# Patient Record
Sex: Female | Born: 1980 | Race: White | Hispanic: No
Health system: Midwestern US, Community
[De-identification: ages and names within clinical notes are randomized; demographics above are authoritative.]

## PROBLEM LIST (undated history)

## (undated) DIAGNOSIS — B9689 Other specified bacterial agents as the cause of diseases classified elsewhere: Secondary | ICD-10-CM

## (undated) DIAGNOSIS — N760 Acute vaginitis: Secondary | ICD-10-CM

## (undated) DIAGNOSIS — F4310 Post-traumatic stress disorder, unspecified: Secondary | ICD-10-CM

## (undated) DIAGNOSIS — F32A Depression, unspecified: Secondary | ICD-10-CM

## (undated) DIAGNOSIS — J40 Bronchitis, not specified as acute or chronic: Secondary | ICD-10-CM

## (undated) DIAGNOSIS — F329 Major depressive disorder, single episode, unspecified: Secondary | ICD-10-CM

## (undated) DIAGNOSIS — F1910 Other psychoactive substance abuse, uncomplicated: Secondary | ICD-10-CM

## (undated) DIAGNOSIS — G510 Bell's palsy: Secondary | ICD-10-CM

## (undated) DIAGNOSIS — R569 Unspecified convulsions: Secondary | ICD-10-CM

## (undated) DIAGNOSIS — J45909 Unspecified asthma, uncomplicated: Secondary | ICD-10-CM

## (undated) HISTORY — PX: OTHER SURGICAL HISTORY: SHX169

## (undated) HISTORY — DX: Bell's palsy: G51.0

---

## 2002-03-18 ENCOUNTER — Encounter: Payer: Self-pay | Admitting: Emergency Medicine

## 2002-03-18 ENCOUNTER — Emergency Department (HOSPITAL_COMMUNITY): Admission: EM | Admit: 2002-03-18 | Discharge: 2002-03-18 | Payer: Self-pay | Admitting: Emergency Medicine

## 2002-06-01 ENCOUNTER — Emergency Department (HOSPITAL_COMMUNITY): Admission: EM | Admit: 2002-06-01 | Discharge: 2002-06-01 | Payer: Self-pay | Admitting: Emergency Medicine

## 2002-08-09 ENCOUNTER — Inpatient Hospital Stay (HOSPITAL_COMMUNITY): Admission: EM | Admit: 2002-08-09 | Discharge: 2002-08-10 | Payer: Self-pay | Admitting: Emergency Medicine

## 2002-08-09 ENCOUNTER — Encounter: Payer: Self-pay | Admitting: Emergency Medicine

## 2005-05-11 ENCOUNTER — Inpatient Hospital Stay (HOSPITAL_COMMUNITY): Admission: EM | Admit: 2005-05-11 | Discharge: 2005-05-12 | Payer: Self-pay | Admitting: Emergency Medicine

## 2007-02-25 ENCOUNTER — Ambulatory Visit: Payer: Self-pay | Admitting: Physician Assistant

## 2007-02-25 ENCOUNTER — Inpatient Hospital Stay (HOSPITAL_COMMUNITY): Admission: AD | Admit: 2007-02-25 | Discharge: 2007-02-25 | Payer: Self-pay | Admitting: Obstetrics & Gynecology

## 2007-03-13 ENCOUNTER — Inpatient Hospital Stay (HOSPITAL_COMMUNITY): Admission: AD | Admit: 2007-03-13 | Discharge: 2007-03-13 | Payer: Self-pay | Admitting: Family Medicine

## 2007-03-13 ENCOUNTER — Ambulatory Visit: Payer: Self-pay | Admitting: Advanced Practice Midwife

## 2007-03-21 ENCOUNTER — Inpatient Hospital Stay (HOSPITAL_COMMUNITY): Admission: AD | Admit: 2007-03-21 | Discharge: 2007-03-23 | Payer: Self-pay | Admitting: Family Medicine

## 2007-03-21 ENCOUNTER — Ambulatory Visit: Payer: Self-pay | Admitting: *Deleted

## 2007-08-26 ENCOUNTER — Other Ambulatory Visit: Admission: RE | Admit: 2007-08-26 | Discharge: 2007-08-26 | Payer: Self-pay | Admitting: Obstetrics and Gynecology

## 2007-11-24 ENCOUNTER — Encounter: Payer: Self-pay | Admitting: Orthopedic Surgery

## 2008-07-30 ENCOUNTER — Encounter (INDEPENDENT_AMBULATORY_CARE_PROVIDER_SITE_OTHER): Payer: Self-pay | Admitting: General Surgery

## 2008-07-30 ENCOUNTER — Ambulatory Visit (HOSPITAL_COMMUNITY): Admission: RE | Admit: 2008-07-30 | Discharge: 2008-07-30 | Payer: Self-pay | Admitting: General Surgery

## 2008-09-16 ENCOUNTER — Other Ambulatory Visit: Admission: RE | Admit: 2008-09-16 | Discharge: 2008-09-16 | Payer: Self-pay | Admitting: Obstetrics and Gynecology

## 2009-04-04 ENCOUNTER — Emergency Department (HOSPITAL_COMMUNITY): Admission: EM | Admit: 2009-04-04 | Discharge: 2009-04-04 | Payer: Self-pay | Admitting: Emergency Medicine

## 2009-07-05 ENCOUNTER — Ambulatory Visit (HOSPITAL_COMMUNITY): Admission: RE | Admit: 2009-07-05 | Discharge: 2009-07-05 | Payer: Self-pay | Admitting: Psychiatry

## 2009-09-03 ENCOUNTER — Inpatient Hospital Stay (HOSPITAL_COMMUNITY): Admission: EM | Admit: 2009-09-03 | Discharge: 2009-09-04 | Payer: Self-pay | Admitting: Emergency Medicine

## 2009-09-04 ENCOUNTER — Ambulatory Visit: Payer: Self-pay | Admitting: Psychiatry

## 2009-09-04 ENCOUNTER — Inpatient Hospital Stay (HOSPITAL_COMMUNITY): Admission: AD | Admit: 2009-09-04 | Discharge: 2009-09-07 | Payer: Self-pay | Admitting: Psychiatry

## 2009-10-07 ENCOUNTER — Emergency Department (HOSPITAL_COMMUNITY): Admission: EM | Admit: 2009-10-07 | Discharge: 2009-10-07 | Payer: Self-pay | Admitting: Emergency Medicine

## 2010-02-05 ENCOUNTER — Encounter: Payer: Self-pay | Admitting: Obstetrics & Gynecology

## 2010-02-08 ENCOUNTER — Emergency Department (HOSPITAL_COMMUNITY)
Admission: EM | Admit: 2010-02-08 | Discharge: 2010-02-08 | Payer: Self-pay | Source: Home / Self Care | Admitting: Emergency Medicine

## 2010-03-30 LAB — BASIC METABOLIC PANEL
BUN: 6 mg/dL (ref 6–23)
Chloride: 109 mEq/L (ref 96–112)
Glucose, Bld: 92 mg/dL (ref 70–99)
Potassium: 3.6 mEq/L (ref 3.5–5.1)

## 2010-03-30 LAB — CARDIAC PANEL(CRET KIN+CKTOT+MB+TROPI)
CK, MB: 0.7 ng/mL (ref 0.3–4.0)
CK, MB: 1 ng/mL (ref 0.3–4.0)
Relative Index: INVALID (ref 0.0–2.5)
Total CK: 59 U/L (ref 7–177)
Total CK: 84 U/L (ref 7–177)
Troponin I: 0.02 ng/mL (ref 0.00–0.06)

## 2010-03-30 LAB — MRSA PCR SCREENING: MRSA by PCR: NEGATIVE

## 2010-03-30 LAB — CBC
HCT: 41.3 % (ref 36.0–46.0)
Platelets: 268 10*3/uL (ref 150–400)
Platelets: 287 10*3/uL (ref 150–400)
RDW: 12.9 % (ref 11.5–15.5)
RDW: 13 % (ref 11.5–15.5)
WBC: 12.7 10*3/uL — ABNORMAL HIGH (ref 4.0–10.5)
WBC: 15.8 10*3/uL — ABNORMAL HIGH (ref 4.0–10.5)

## 2010-03-30 LAB — URINALYSIS, ROUTINE W REFLEX MICROSCOPIC
Leukocytes, UA: NEGATIVE
Nitrite: NEGATIVE
Nitrite: NEGATIVE
Specific Gravity, Urine: 1.01 (ref 1.005–1.030)
Specific Gravity, Urine: 1.025 (ref 1.005–1.030)
Urobilinogen, UA: 0.2 mg/dL (ref 0.0–1.0)
Urobilinogen, UA: 0.2 mg/dL (ref 0.0–1.0)
pH: 6 (ref 5.0–8.0)

## 2010-03-30 LAB — COMPREHENSIVE METABOLIC PANEL
ALT: 12 U/L (ref 0–35)
Albumin: 4.3 g/dL (ref 3.5–5.2)
Alkaline Phosphatase: 83 U/L (ref 39–117)
Potassium: 3.5 mEq/L (ref 3.5–5.1)
Sodium: 139 mEq/L (ref 135–145)
Total Protein: 7.5 g/dL (ref 6.0–8.3)

## 2010-03-30 LAB — DIFFERENTIAL
Basophils Absolute: 0 10*3/uL (ref 0.0–0.1)
Lymphocytes Relative: 27 % (ref 12–46)
Monocytes Absolute: 1.1 10*3/uL — ABNORMAL HIGH (ref 0.1–1.0)
Neutro Abs: 10.3 10*3/uL — ABNORMAL HIGH (ref 1.7–7.7)
Neutrophils Relative %: 65 % (ref 43–77)

## 2010-03-30 LAB — PREGNANCY, URINE: Preg Test, Ur: NEGATIVE

## 2010-03-30 LAB — URINE MICROSCOPIC-ADD ON

## 2010-03-30 LAB — ETHANOL: Alcohol, Ethyl (B): <5 mg/dL (ref 0–10)

## 2010-03-30 LAB — RAPID URINE DRUG SCREEN, HOSP PERFORMED
Opiates: NOT DETECTED
Tetrahydrocannabinol: POSITIVE — AB

## 2010-04-23 LAB — HEMOGLOBIN AND HEMATOCRIT, BLOOD
HCT: 43.5 % (ref 36.0–46.0)
Hemoglobin: 15.4 g/dL — ABNORMAL HIGH (ref 12.0–15.0)

## 2010-04-23 LAB — BASIC METABOLIC PANEL
Calcium: 9.7 mg/dL (ref 8.4–10.5)
Creatinine, Ser: 0.91 mg/dL (ref 0.4–1.2)
GFR calc Af Amer: >60 mL/min (ref >60)
GFR calc non Af Amer: >60 mL/min (ref >60)
Sodium: 141 mEq/L (ref 135–145)

## 2010-05-30 NOTE — Op Note (Signed)
NAME:  Glenda [name], Glenda [name]               ACCOUNT NO.:  [id number]   MEDICAL RECORD NO.:  [id number]          PATIENT TYPE:  AMB   LOCATION:  DAY                           FACILITY:  [facility]   PHYSICIAN:  Tilford Pillar, MD      DATE OF BIRTH:  30-Aug-1980   DATE OF PROCEDURE:  07/30/2008  DATE OF DISCHARGE:                               OPERATIVE REPORT   PREOPERATIVE DIAGNOSIS:  Frontal scalp cyst.   POSTOPERATIVE DIAGNOSIS:  Frontal scalp lesion/mass.   PROCEDURE:  Excision of frontal scalp mass via 2-cm incision.   SURGEON:  Tilford Pillar, MD   ANESTHESIA:  MAC with 1% lidocaine with epinephrine.   ESTIMATED BLOOD LOSS:  Minimal.   SPECIMEN:  Soft tissue mass of the frontal scalp, suspect lipoma.   INDICATIONS:  The patient is a 30 year old female who presented to my  office with a small nodule on her frontal scalp.  This had slowly  increased in size and due to its increase in size and position, the  patient was concerned about any additional changes.  We did discuss the  risks, benefits, and alternatives of excision including, but not limited  to risk of bleeding, infection, recurrence.  The patient's questions and  concerns were addressed and the patient was consented for the planned  procedure.   OPERATION:  The patient was taken to the operating room, was placed in  the supine position on the operating table, at which time the sedation  was administered.  Once the patient was sedated, her scalp was prepped  with Betadine solution and draped in standard fashion.  Local anesthetic  has been instilled around the planned area of incision with 1%  lidocaine.  A skin incision was created with a #15 blade scalpel.  Additional dissection down to the subcuticular tissue was carried out  using needle tip electrocautery.  This includes dissection around the  soft tissue mass.  The mass was adjacent, but it was not connected to  the frontal skull.  Periosteum was visualized, but was not  entered into,  continued to dissect the mass free, and once free, it was placed on the  back table and sent as a permanent specimen to Pathology.  At this time,  the field was irrigated and a 4-0 Monocryl suture was utilized to  reapproximate the skin edges in a running subcuticular suture.  The skin  was washed and dried with a moist and dry towel.  Benzoin was applied  around the incision.  Half-inch Steri-Strips were placed over the  incision.  The drapes  were removed.  The patient was allowed to come out of general anesthetic  and transferred back to regular hospital bed.  At the conclusion of the  procedure, all instrument, sponge, and needle counts were correct.  The  patient tolerated the procedure well.      Tilford Pillar, MD  Electronically Signed     BZ/MEDQ  D:  07/30/2008  T:  07/30/2008  Job:  [id number]

## 2010-05-30 NOTE — H&P (Signed)
NAME:  Glenda [name], Glenda [name]               ACCOUNT NO.:  [id number]   MEDICAL RECORD NO.:  [id number]           PATIENT TYPE:  AMB   LOCATION:  DAY                           FACILITY:  [facility]   PHYSICIAN:  Tilford Pillar, MD      DATE OF BIRTH:  12-Aug-1980   DATE OF ADMISSION:  DATE OF DISCHARGE:  LH                              HISTORY & PHYSICAL   CHIEF COMPLAINT:  Frontal scalp cyst.   HISTORY OF PRESENT ILLNESS:  The patient is a 30 year old female, who  represents to my office with a history of a nodule on her frontal scalp  near the hairline.  This has been present for several years and slowly  increased in size.  The patient had been evaluated previously and the  risks, benefits, and also excision have been discussed.  The patient had  decided to continue and watch this conservatively.  In the interim, she  has noted continued increase in size and due to its prominence and  location and due to the increasing size, and has concerned about having  excised.  She has had no fever and chills.  She has had no significant  weight change with weight loss or weight gain.  She has had no erythema  or discharge from the lesion.  She has not noticed any other changes.   PAST MEDICAL HISTORY:  None.   PAST SURGICAL HISTORY:  None.   MEDICATIONS:  None.   ALLERGIES:  No known drug allergies.   SOCIAL HISTORY:  One-pack per day smoker.  No alcohol use.  No  recreational drug use.   REVIEW OF SYSTEMS:  Unremarkable for all systems other than skin as per  HPI.  Otherwise unremarkable for constitutional, eyes, ears, nose,  throat, respiratory, cardiovascular, gastrointestinal, genitourinary,  endocrine, and neuro.  On musculoskeletal, she does complain of  arthralgias of the neck, back, and joint.   PHYSICAL EXAMINATION:  GENERAL:  The patient is a healthy-appearing  female.  She is calm.  She is not in any acute distress.  She is alert  and oriented x3.  HEENT:  Scalp.  Hair is palpated in  appearance.  There  is no evidence of any brutal hair.  She has approximately 1.5 cm mobile  soft, nontender nodule near the midline of her left frontal scalp.  This  does not appear to be attached to underlying structures or surfaces.  There is no overlying erythema.  There is no fluctuance.  Eyes, pupils  are equal, round, and reactive.  Extraocular movements are intact.  No  conjunctival pallor are noted.  Oral mucosa is pink.  Normal occlusion.  NECK:  Trachea is midline.  No cervical lymphadenopathy.  No thyroid  nodularity or goiters apparent.  PULMONARY:  Unlabored respiration.  She is clear to auscultation  bilaterally.  CARDIOVASCULAR:  Regular rate and rhythm.  She has 2+ radial, femoral,  and dorsalis pedis pulses bilaterally.  ABDOMEN:  Positive bowel sounds.  Abdomen is soft and nontender.  EXTREMITIES:  Warm and dry.   ASSESSMENT AND PLAN:  Sebaceous cyst of the  left frontal scalp.  Risks,  benefits, and alternatives again were discussed with the patient  including, but not limited to the risk of bleeding, infection, and  recurrence.  It was associated with excision of a cyst of the scalp.  Her questions and concerns were addressed and the patient does wish to  proceed with a excision of the cyst.  This will be arranged at her  earliest convenience as an outpatient.      Tilford Pillar, MD  Electronically Signed     BZ/MEDQ  D:  07/15/2008  T:  07/16/2008  Job:  [id number]   cc:   Lorin Picket A. Gerda Diss, MD  Fax: [phone]

## 2010-06-02 NOTE — H&P (Signed)
NAME:  Glenda [name], Glenda [name]               ACCOUNT NO.:  [id number]   MEDICAL RECORD NO.:  [id number]          PATIENT TYPE:  AMB   LOCATION:  DAY                           FACILITY:  [facility]   PHYSICIAN:  Tilford Pillar, MD      DATE OF BIRTH:  1980/02/22   DATE OF ADMISSION:  DATE OF DISCHARGE:  LH                              HISTORY & PHYSICAL   CHIEF COMPLAINT:  Cyst on forehead.   HISTORY OF PRESENT ILLNESS:  The patient is a 30 year old female who  noted several years ago a sizable knot on her forehead.  This has slowly  increased in size.  She has denied any discharge or drainage from this.  She has had no overlying erythema or no significant pain associated with  this.  She does not have any other prior history of cyst.  No other skin  lesions.   PAST MEDICAL HISTORY:  None.   PAST SURGICAL HISTORY:  None.   MEDICATIONS:  Flexeril.   ALLERGIES:  No known drug allergies.   SOCIAL HISTORY:  She is 1-pack-per-day smoker.  No alcohol use.  No  recreational drug use.  She has had one pregnancy.  Occupation, no  current employment.   REVIEW OF SYSTEMS:  CONSTITUTIONAL:  Complains of headaches.  EYES:  Unremarkable.  EARS, NOSE, AND THROAT:  Unremarkable.  RESPIRATORY:  Unremarkable.  CARDIOVASCULAR:  Unremarkable.  GASTROINTESTINAL:  Unremarkable.  GENITOURINARY:  Unremarkable.  MUSCULOSKELETAL:  Arthralgias of the neck, back, and joints.  SKIN:  Unremarkable.  ENDOCRINE:  Unremarkable.  NEURO:  Unremarkable.   PHYSICAL EXAMINATION:  GENERAL:  The patient is a healthy-appearing  female in no acute distress.  She is alert and oriented x3.  HEENT:  Scalp, she does have a mobile, round, nontender, soft nodule on  the frontal aspect of the scalp.  This is in the midline of her frontal  region.  Eyes, pupils equal, round, and reactive to light.  Extraocular  movements are intact.  No scleral icterus or conjunctival pallor is  noted.  Oral mucosa is pink.  Normal occlusion.  NECK:   Trachea is midline.  She does have a small palpable right thyroid  nodule.  This is nontender.  No cervical lymphadenopathy.  PULMONARY:  Unlabored respirations.  No wheezes.  No crackles.  She is  clear to auscultation bilaterally.  CARDIOVASCULAR:  Regular rate and rhythm.  No murmurs or gallops.  She  has 2+ radial pulses.  ABDOMEN:  Positive bowel sounds.  Abdomen is soft and nontender.  SKIN:  Warm and dry.   ASSESSMENT AND PLAN:  1. Sebaceous cyst in the scalp.  2. Thyroid nodule.   At this point, I did discuss with the patient the risks, benefits, and  alternatives of an excision.  On the initial evaluation, the patient did  wish to wait until in the future to address this; however, at the  initial time, the patient has decided to proceed with excision.  Risks,  benefits, and alternatives including, but not limited to risk of  bleeding, infection, and recurrence were discussed.  We  will proceed if  the patient with regards to the nodule.  Apparently, the patient is  being evaluated and worked up by her primary care physician; however, if  it persists, I would recommend proceeding with an ultrasound of the  thyroid for additional evaluation and possible FNA.      Tilford Pillar, MD  Electronically Signed     BZ/MEDQ  D:  10/16/2007  T:  10/17/2007  Job:  [id number]

## 2010-06-02 NOTE — Discharge Summary (Signed)
   NAME:  Glenda Glenda [name], Glenda Glenda [name]                         ACCOUNT NO.:  [id number]   MEDICAL RECORD NO.:  [id number]                   PATIENT TYPE:  INP   LOCATION:  A215                                 FACILITY:  [facility]   PHYSICIAN:  Donna Bernard, M.D.             DATE OF BIRTH:  07-08-1980   DATE OF ADMISSION:  08/09/2002  DATE OF DISCHARGE:  08/10/2002                                 DISCHARGE SUMMARY   DISCHARGE DIAGNOSES:  1. Seizure disorder.  2. Benzodiazepine and cocaine and marijuana abuse.  3. Weight loss most likely secondary to #2.   DISPOSITION:  1. The patient discharged to home.  2. The patient advised to avoid illegal substances.  3. Patient's EEG scheduled.  4. Follow up in the office in one week.  5. Follow up with [facility] for drug counseling     as scheduled.   HISTORY OF PRESENT ILLNESS:  Please see H&P as dictated.   HOSPITAL COURSE:  This patient is Glenda [name] 30 year old white female who presented  to the emergency room with Glenda [name] one-week history of intermittent seizure  activity.  She was worked up with negative CT scan.  Her urine toxicology  revealed cocaine, benzodiazepines and marijuana.  The patient was admitted  for observation.  We had Glenda [name] serious discussion with her regarding the  likelihood that her drug abuse is significantly causing her harm.  The  patient claims that she has no significant depression or suicidal ideations  with this.  The patient was adamantly opposed to Glenda [name] referral for inpatient  management for her many difficulties.  Due to the fact that she posed no  acute homicidal or suicidal risks, I felt that Glenda [name] commitment proceeding would  simply not succeed.  We discussed this with the family.  The patient was  given IV fluids along with Pepcid as needed for symptomatology.  Telemetry  was initially assessed and then cancelled after normal.  On the day of  discharge, the patient is feeling better.  Please see the mental  health  assessment as documented in the chart.  Patient followup with diagnosis and  disposition as noted above.                                               Donna Bernard, M.D.    WSL/MEDQ  D:  08/31/2002  T:  08/31/2002  Job:  [id number]

## 2010-06-02 NOTE — Discharge Summary (Signed)
NAME:  Glenda [name], Glenda [name]               ACCOUNT NO.:  [id number]   MEDICAL RECORD NO.:  [id number]          PATIENT TYPE:  INP   LOCATION:  A212                          FACILITY:  [facility]   PHYSICIAN:  Margaretmary Dys, M.D.DATE OF BIRTH:  [birth year]   DATE OF ADMISSION:  05/11/2005  DATE OF DISCHARGE:  04/28/2007LH                                 DISCHARGE SUMMARY   DISCHARGE DIAGNOSES:  1.  Seizures, likely from alcohol intoxication/drug abuse.  2.  History of polysubstance abuse with marijuana and alcohol.  3.  Past history of cocaine use.  4.  Possible clinical depression, not on treatment.   DISCHARGE MEDICATIONS:  Dilantin 100 mg p.o. t.i.d.   DIET:  Regular.   ACTIVITY:  As tolerated.   LABORATORY DATA/IMAGING STUDIES:  CBC showed a white blood cell count of  18,000 with 84% neutrophils, hemoglobin 15, platelet count 327.  Magnesium  level was normal.  A urine pregnancy test was negative.  Alcohol level was  less than 5.   CAT scan of the head showed mild sphenoidal sinus disease; otherwise, it was  unremarkable.   HOSPITAL COURSE:  The patient is a 30 year old Caucasian female with a past  medical history of polysubstance abuse and possible depression who was  previously admitted in 2004 with seizures which were thought to be related  to cocaine use.  According to the patient and the mother, she has not used  cocaine since 2005.  The patient was doing well until yesterday when  according to her mother she binged on alcohol and was described as being  intoxicated.  The patient had three episodes of seizures which were  witnessed by the boyfriend, but the boyfriend was not available throughout  hospitalization.  The patient was evaluated in the ED and was to be sent  home; however, she had a grand mal seizure in the emergency room subsequent  to be admitted.  On admission, she complained of some headache, 8/10;  however, she was doing okay.  He had nausea and also two  episodes of emesis.  As a result, the patient was admitted and received Dilantin.  Seizures were  stopped.  Throughout the course of hospitalization, she did not have anymore  seizures.  Her urine was positive for benzodiazepines and THC.   Discussions regarding  illicit drug use were explained to her with the risks  of frequent seizures.   She will return to see her primary care physician in next two to three  weeks.   DISPOSITION:  To home in satisfactory condition.      Margaretmary Dys, M.D.  Electronically Signed     AM/MEDQ  D:  07/01/2005  T:  07/01/2005  Job:  [id number]
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
## 2010-10-09 LAB — RAPID URINE DRUG SCREEN, HOSP PERFORMED
Amphetamines: NOT DETECTED
Barbiturates: NOT DETECTED
Opiates: NOT DETECTED
Tetrahydrocannabinol: POSITIVE — AB

## 2010-10-09 LAB — CBC
HCT: 39.9
Hemoglobin: 13.9
MCHC: 34.8
Platelets: 287
RDW: 13.2

## 2011-02-07 ENCOUNTER — Emergency Department (HOSPITAL_COMMUNITY)
Admission: EM | Admit: 2011-02-07 | Discharge: 2011-02-07 | Disposition: A | Discharge status: Home / Self Care | Payer: Self-pay | Attending: Emergency Medicine | Admitting: Emergency Medicine

## 2011-02-07 ENCOUNTER — Encounter (HOSPITAL_COMMUNITY): Payer: Self-pay | Admitting: *Deleted

## 2011-02-07 DIAGNOSIS — F17200 Nicotine dependence, unspecified, uncomplicated: Secondary | ICD-10-CM | POA: Insufficient documentation

## 2011-02-07 DIAGNOSIS — R062 Wheezing: Secondary | ICD-10-CM | POA: Insufficient documentation

## 2011-02-07 DIAGNOSIS — R599 Enlarged lymph nodes, unspecified: Secondary | ICD-10-CM | POA: Insufficient documentation

## 2011-02-07 DIAGNOSIS — R0982 Postnasal drip: Secondary | ICD-10-CM | POA: Insufficient documentation

## 2011-02-07 DIAGNOSIS — R070 Pain in throat: Secondary | ICD-10-CM | POA: Insufficient documentation

## 2011-02-07 DIAGNOSIS — R0981 Nasal congestion: Secondary | ICD-10-CM

## 2011-02-07 DIAGNOSIS — R5381 Other malaise: Secondary | ICD-10-CM | POA: Insufficient documentation

## 2011-02-07 DIAGNOSIS — R05 Cough: Secondary | ICD-10-CM | POA: Insufficient documentation

## 2011-02-07 DIAGNOSIS — J069 Acute upper respiratory infection, unspecified: Secondary | ICD-10-CM

## 2011-02-07 DIAGNOSIS — J3489 Other specified disorders of nose and nasal sinuses: Secondary | ICD-10-CM | POA: Insufficient documentation

## 2011-02-07 DIAGNOSIS — J9801 Acute bronchospasm: Secondary | ICD-10-CM | POA: Insufficient documentation

## 2011-02-07 DIAGNOSIS — R5383 Other fatigue: Secondary | ICD-10-CM | POA: Insufficient documentation

## 2011-02-07 DIAGNOSIS — R059 Cough, unspecified: Secondary | ICD-10-CM | POA: Insufficient documentation

## 2011-02-07 DIAGNOSIS — J209 Acute bronchitis, unspecified: Secondary | ICD-10-CM

## 2011-02-07 HISTORY — DX: Bronchitis, not specified as acute or chronic: J40

## 2011-02-07 MED ORDER — AZITHROMYCIN 250 MG PO TABS: ORAL_TABLET | ORAL | Status: AC

## 2011-02-07 MED ORDER — HYDROCODONE-ACETAMINOPHEN 5-325 MG PO TABS: 1.0000 | ORAL_TABLET | ORAL | Status: DC | PRN

## 2011-02-07 MED ORDER — AZITHROMYCIN 250 MG PO TABS
500.0000 mg | ORAL_TABLET | Freq: Once | ORAL | Status: AC
  Administered 2011-02-07: 500 mg via ORAL
  Filled 2011-02-07: qty 2

## 2011-02-07 MED ORDER — ALBUTEROL SULFATE HFA 108 (90 BASE) MCG/ACT IN AERS: 2.0000 | INHALATION_SPRAY | RESPIRATORY_TRACT | Status: DC | PRN

## 2011-02-07 MED ORDER — ALBUTEROL SULFATE HFA 108 (90 BASE) MCG/ACT IN AERS
2.0000 | INHALATION_SPRAY | RESPIRATORY_TRACT | Status: DC | PRN
  Administered 2011-02-07: 2 via RESPIRATORY_TRACT
  Filled 2011-02-07: qty 6.7

## 2011-02-07 MED ORDER — HYDROCODONE-ACETAMINOPHEN 5-325 MG PO TABS
2.0000 | ORAL_TABLET | Freq: Once | ORAL | Status: AC
  Administered 2011-02-07: 2 via ORAL
  Filled 2011-02-07: qty 2

## 2011-02-07 MED ORDER — ONDANSETRON 8 MG PO TBDP
8.0000 mg | ORAL_TABLET | Freq: Once | ORAL | Status: AC
  Administered 2011-02-07: 8 mg via ORAL
  Filled 2011-02-07: qty 1

## 2011-02-07 MED ORDER — ONDANSETRON HCL 8 MG PO TABS: 8.0000 mg | ORAL_TABLET | Freq: Three times a day (TID) | ORAL | Status: AC | PRN

## 2011-02-07 NOTE — ED Notes (Signed)
MD at bedside. 

## 2011-02-07 NOTE — ED Notes (Signed)
Pt reports cough and cold symptoms about 1 month.  Reports nausea, congestion, productive cough.  Denies fever.

## 2011-02-07 NOTE — ED Provider Notes (Signed)
History     CSN: [id number]  Arrival date & time 02/07/11  4098   First MD Initiated Contact with Patient 02/07/11 0321      Chief Complaint  Patient presents with  . URI    (Consider location/radiation/quality/duration/timing/severity/associated sxs/prior treatment) HPI Comments: The patient presents for evaluation of upper respiratory tract infection symptoms that have been ongoing without improvement over the past month. She reports nasal congestion, sinus pressure, postnasal drip, cough that is productive of mucus. She denies fever or chills or chest discomfort. On examination, she is coughing frequently, but has no wheezing. Nasal congestion is evident.  Patient is a 31 y.o. female presenting with URI. The history is provided by the patient.  URI The primary symptoms include fatigue, sore throat, swollen glands, cough and wheezing. Primary symptoms do not include fever, headaches, ear pain, abdominal pain, nausea, vomiting, myalgias, arthralgias or rash. Episode onset: 4 weeks ago. This is a new problem. The problem has been gradually worsening.  The sore throat is not accompanied by trouble swallowing, drooling or stridor.  The swelling is not associated with trouble swallowing or neck pain.  Symptoms associated with the illness include sinus pressure, congestion and rhinorrhea. The illness is not associated with chills, plugged ear sensation or facial pain.    Past Medical History  Diagnosis Date  . Bronchitis     History reviewed. No pertinent past surgical history.  History reviewed. No pertinent family history.  History  Substance Use Topics  . Smoking status: Current Some Day Smoker  . Smokeless tobacco: Not on file  . Alcohol Use: Yes    OB History    Grav Para Term Preterm Abortions TAB SAB Ect Mult Living                  Review of Systems  Constitutional: Positive for fatigue. Negative for fever, chills, diaphoresis, activity change, appetite change and  unexpected weight change.  HENT: Positive for congestion, sore throat, rhinorrhea, postnasal drip and sinus pressure. Negative for hearing loss, ear pain, nosebleeds, facial swelling, sneezing, drooling, mouth sores, trouble swallowing, neck pain, neck stiffness, dental problem, voice change, tinnitus and ear discharge.   Eyes: Negative for photophobia, pain, discharge, redness and itching.  Respiratory: Positive for cough and wheezing. Negative for choking, chest tightness, shortness of breath and stridor.   Cardiovascular: Negative for chest pain, palpitations and leg swelling.  Gastrointestinal: Negative for nausea, vomiting, abdominal pain, diarrhea, constipation, blood in stool, abdominal distention and anal bleeding.  Genitourinary: Negative for dysuria, urgency, frequency, hematuria, flank pain and difficulty urinating.  Musculoskeletal: Negative for myalgias, back pain, joint swelling, arthralgias and gait problem.  Skin: Negative for color change, pallor, rash and wound.  Neurological: Negative for dizziness, weakness, light-headedness and headaches.  Hematological: Positive for adenopathy. Does not bruise/bleed easily.  Psychiatric/Behavioral: Negative.     Allergies  Prednisone  Home Medications   Current Outpatient Rx  Name Route Sig Dispense Refill  . ALBUTEROL SULFATE HFA 108 (90 BASE) MCG/ACT IN AERS Inhalation Inhale 2 puffs into the lungs every 6 (six) hours as needed.    Marland Kitchen CITALOPRAM HYDROBROMIDE 20 MG PO TABS Oral Take 20 mg by mouth daily.      BP 114/83  Pulse 78  Temp(Src) 97.6 F (36.4 C) (Oral)  Resp 20  Ht 5\' 6"  (1.676 m)  Wt 130 lb (58.968 kg)  BMI 20.98 kg/m2  SpO2 99%  Physical Exam  Nursing note and vitals reviewed. Constitutional: She is oriented  to person, place, and time. She appears well-developed and well-nourished. No distress.  HENT:  Head: Normocephalic and atraumatic.  Right Ear: Hearing, tympanic membrane, external ear and ear canal  normal.  Left Ear: Hearing, tympanic membrane, external ear and ear canal normal.  Nose: Mucosal edema and rhinorrhea present. No sinus tenderness or nasal deformity. Right sinus exhibits maxillary sinus tenderness and frontal sinus tenderness. Left sinus exhibits maxillary sinus tenderness and frontal sinus tenderness.  Mouth/Throat: Uvula is midline, oropharynx is clear and moist and mucous membranes are normal. No uvula swelling. No oropharyngeal exudate, posterior oropharyngeal edema, posterior oropharyngeal erythema or tonsillar abscesses.  Eyes: Conjunctivae and EOM are normal. Pupils are equal, round, and reactive to light.  Neck: Normal range of motion. Neck supple. No JVD present. No tracheal deviation present.  Cardiovascular: Normal rate, regular rhythm, normal heart sounds and intact distal pulses.  Exam reveals no gallop and no friction rub.   No murmur heard. Pulmonary/Chest: Effort normal. No accessory muscle usage or stridor. Not tachypneic. No respiratory distress. She has no decreased breath sounds. She has wheezes in the right upper field, the right middle field, the left upper field and the left middle field. She has no rhonchi. She has no rales. She exhibits no tenderness.  Abdominal: Soft. Bowel sounds are normal. She exhibits no distension. There is no tenderness. There is no rebound and no guarding.  Musculoskeletal: Normal range of motion. She exhibits no edema and no tenderness.  Lymphadenopathy:    She has no cervical adenopathy.  Neurological: She is alert and oriented to person, place, and time. She has normal reflexes. No cranial nerve deficit. She exhibits normal muscle tone. Coordination normal.  Skin: Skin is warm and dry. No rash noted. She is not diaphoretic. No erythema. No pallor.  Psychiatric: She has a normal mood and affect. Her behavior is normal. Judgment and thought content normal.    ED Course  Procedures (including critical care time)  Labs Reviewed -  No data to display No results found.   No diagnosis found.    MDM  The patient has what may have likely started as a viral upper respiratory tract infection, but at this time she has findings suggestive of bronchitis, with wheezing, and due to its prolonged course without improvement, I will be prescribing her antibiotics to treat this. I do not hear any rails to suggest pneumonia and her oxygen saturations are normal on room air with a no significant respiratory distress, and I do not find chest x-ray to be of value in deciding the course of treatment for her at this time. I will also prescribe her hydrocodone for cough suppression as well as providing her with a beta agonist inhaler in the ED to use for bronchospasm, wheezing, and cough. I will discharge the patient home for outpatient followup as needed. The patient states her understanding of and agreement with the plan of care.        Felisa Bonier, MD 02/07/11 [phone]

## 2011-02-16 ENCOUNTER — Emergency Department (HOSPITAL_COMMUNITY)
Admission: EM | Admit: 2011-02-16 | Discharge: 2011-02-16 | Disposition: A | Discharge status: Home / Self Care | Payer: Self-pay | Attending: Emergency Medicine | Admitting: Emergency Medicine

## 2011-02-16 ENCOUNTER — Encounter (HOSPITAL_COMMUNITY): Payer: Self-pay

## 2011-02-16 DIAGNOSIS — F17200 Nicotine dependence, unspecified, uncomplicated: Secondary | ICD-10-CM | POA: Insufficient documentation

## 2011-02-16 DIAGNOSIS — N72 Inflammatory disease of cervix uteri: Secondary | ICD-10-CM | POA: Insufficient documentation

## 2011-02-16 DIAGNOSIS — IMO0002 Reserved for concepts with insufficient information to code with codable children: Secondary | ICD-10-CM | POA: Insufficient documentation

## 2011-02-16 DIAGNOSIS — T192XXA Foreign body in vulva and vagina, initial encounter: Secondary | ICD-10-CM | POA: Insufficient documentation

## 2011-02-16 LAB — WET PREP, GENITAL
Clue Cells Wet Prep HPF POC: NONE SEEN
Trich, Wet Prep: NONE SEEN
Yeast Wet Prep HPF POC: NONE SEEN

## 2011-02-16 MED ORDER — DOXYCYCLINE HYCLATE 100 MG PO TABS
ORAL_TABLET | ORAL | Status: AC
  Filled 2011-02-16: qty 1

## 2011-02-16 MED ORDER — DOXYCYCLINE HYCLATE 100 MG PO CAPS: 100.0000 mg | ORAL_CAPSULE | Freq: Two times a day (BID) | ORAL | Status: AC

## 2011-02-16 MED ORDER — DOXYCYCLINE HYCLATE 100 MG PO TABS
100.0000 mg | ORAL_TABLET | Freq: Once | ORAL | Status: AC
  Administered 2011-02-16: 100 mg via ORAL

## 2011-02-16 NOTE — ED Notes (Signed)
Pelvic set up at bedside. PA notified.

## 2011-02-16 NOTE — ED Notes (Addendum)
Pt states she was told, " you have something in your vagina" pt last placed a tampon 1 month ago. C/o foul smell with discharge, abd pain and vomiting at times. No pain today or vomiting today.

## 2011-02-16 NOTE — ED Notes (Signed)
Pt presents with foreign body to vagina. Pt states "I have a tampon stuck inside me". Pt reports tampon has been inside vagina for approx 1 month. Pt states she has thin yellow discharge. Pt also reports abdominal cramping 3/10. NAD at this time.

## 2011-02-16 NOTE — ED Notes (Signed)
Pelvic performed by T.Triplett PA. Tampon removed. Tampon yellow/green with foul odor noted from vagina.

## 2011-02-16 NOTE — ED Notes (Signed)
Pt a/ox4. Resp even and unlabored. NAD at this time. D/C instructions and rx reviewed with pt. Pt verbalized understanding. Pt ambulated to lobby with steady gate.  

## 2011-02-16 NOTE — ED Provider Notes (Signed)
History     CSN: [id number]  Arrival date & time 02/16/11  1233   First MD Initiated Contact with Patient 02/16/11 1319      Chief Complaint  Patient presents with  . Foreign Body in Vagina    (Consider location/radiation/quality/duration/timing/severity/associated sxs/prior treatment) HPI Comments: Patient comes to the emergency department with complaint of a retained tampon in her vagina. She states she had sexual intercourse with someone and was told she had "something in your vagina."  Patient states her last menstrual period was approximately one month ago and does not recall removing the tampon. She also reports intermittent vomiting for several weeks, but none recently.  She also states that she has recently noticed a foul odor from her vagina and a yellow to white vaginal discharge.  She also reports intermittent lower abdominal pain. She denies fever, vaginal bleeding, or abdominal pain at this time.  Patient is a 31 y.o. female presenting with foreign body in vagina. The history is provided by the patient. No language interpreter was used.  Foreign Body in Vagina This is a new problem. The current episode started more than 1 month ago. The problem occurs constantly. The problem has been unchanged. Associated symptoms include abdominal pain and vomiting. Pertinent negatives include no chest pain, fever, headaches, nausea, numbness, rash, sore throat, swollen glands or weakness. The symptoms are aggravated by intercourse. She has tried nothing for the symptoms. The treatment provided no relief.    Past Medical History  Diagnosis Date  . Bronchitis     History reviewed. No pertinent past surgical history.  No family history on file.  History  Substance Use Topics  . Smoking status: Current Some Day Smoker  . Smokeless tobacco: Not on file  . Alcohol Use: Yes    OB History    Grav Para Term Preterm Abortions TAB SAB Ect Mult Living                  Review of Systems    Constitutional: Negative for fever, activity change and appetite change.  HENT: Negative for sore throat.   Cardiovascular: Negative for chest pain.  Gastrointestinal: Positive for vomiting and abdominal pain. Negative for nausea.  Genitourinary: Positive for vaginal discharge and vaginal pain. Negative for dysuria, hematuria, decreased urine volume, vaginal bleeding and difficulty urinating.  Musculoskeletal: Negative.   Skin: Negative.  Negative for rash.  Neurological: Negative for dizziness, weakness, numbness and headaches.  All other systems reviewed and are negative.    Allergies  Prednisone  Home Medications   Current Outpatient Rx  Name Route Sig Dispense Refill  . ALBUTEROL SULFATE HFA 108 (90 BASE) MCG/ACT IN AERS Inhalation Inhale 2 puffs into the lungs every 4 (four) hours as needed for wheezing or shortness of breath (cough). 1 Inhaler 0  . CITALOPRAM HYDROBROMIDE 40 MG PO TABS Oral Take 40 mg by mouth daily.      BP 137/85  Pulse 55  Temp 98 F (36.7 C)  Resp 16  Ht 5\' 6"  (1.676 m)  Wt 130 lb (58.968 kg)  BMI 20.98 kg/m2  SpO2 100%  LMP 01/16/2011  Physical Exam  Nursing note and vitals reviewed. Constitutional: She is oriented to person, place, and time. She appears well-developed and well-nourished. No distress.  HENT:  Head: Normocephalic and atraumatic.  Mouth/Throat: Oropharynx is clear and moist.  Cardiovascular: Normal rate, regular rhythm and normal heart sounds.   Pulmonary/Chest: Effort normal and breath sounds normal. No respiratory distress.  Abdominal:  Soft. She exhibits no distension and no mass. There is no tenderness. There is no rebound and no guarding.  Genitourinary: Uterus normal. Cervix exhibits no motion tenderness, no discharge and no friability. Right adnexum displays no mass and no tenderness. Left adnexum displays no mass and no tenderness. There is tenderness around the vagina. No bleeding around the vagina. There is a foreign  body around the vagina. Vaginal discharge found.       Retained tampon was completely removed by me using forceps.  Pt tolerated well.  Mild to moderate vaginal discharge.  No CMT, bleeding, no adnexal masses or tenderness  Musculoskeletal: Normal range of motion. She exhibits no tenderness.  Neurological: She is alert and oriented to person, place, and time. No cranial nerve deficit. She exhibits normal muscle tone. Coordination normal.  Skin: Skin is warm and dry.    ED Course  Procedures (including critical care time)  Results for orders placed during the hospital encounter of 02/16/11  WET PREP, GENITAL      Component Value Range   Yeast Wet Prep HPF POC NONE SEEN  NONE SEEN    Trich, Wet Prep NONE SEEN  NONE SEEN    Clue Cells Wet Prep HPF POC NONE SEEN  NONE SEEN    WBC, Wet Prep HPF POC FEW (*) NONE SEEN       GC and chlaymdia , RPR results are pending   MDM     Patient feels better. Vitals are stable. She is non-toxic appearing. She has ambulated to the restroom and outside several times during her ED stay.  Her abdomen remains soft is nontender. I advised her to close followup with the health department or to return here for worsening symptoms.  Pt feels improved after observation and/or treatment in ED. Patient / Family / Caregiver understand and agree with initial ED impression and plan with expectations set for ED visit. Pt stable in ED with no significant deterioration in condition.    Islam Villescas L. Evie Croston, [place] 02/16/11 1457

## 2011-02-17 LAB — GC/CHLAMYDIA PROBE AMP, GENITAL: GC Probe Amp, Genital: NEGATIVE

## 2011-02-17 NOTE — ED Provider Notes (Signed)
Medical screening examination/treatment/procedure(s) were performed by non-physician practitioner and as supervising physician I was immediately available for consultation/collaboration.   Suzi Roots, MD 02/17/11 [phone]

## 2011-09-14 ENCOUNTER — Emergency Department (HOSPITAL_COMMUNITY)
Admission: EM | Admit: 2011-09-14 | Discharge: 2011-09-14 | Disposition: A | Discharge status: Home / Self Care | Payer: Self-pay | Attending: Emergency Medicine | Admitting: Emergency Medicine

## 2011-09-14 ENCOUNTER — Encounter (HOSPITAL_COMMUNITY): Payer: Self-pay | Admitting: *Deleted

## 2011-09-14 DIAGNOSIS — J45909 Unspecified asthma, uncomplicated: Secondary | ICD-10-CM | POA: Insufficient documentation

## 2011-09-14 DIAGNOSIS — J029 Acute pharyngitis, unspecified: Secondary | ICD-10-CM | POA: Insufficient documentation

## 2011-09-14 DIAGNOSIS — F17200 Nicotine dependence, unspecified, uncomplicated: Secondary | ICD-10-CM | POA: Insufficient documentation

## 2011-09-14 HISTORY — DX: Unspecified asthma, uncomplicated: J45.909

## 2011-09-14 LAB — RAPID STREP SCREEN (MED CTR MEBANE ONLY): Streptococcus, Group A Screen (Direct): NEGATIVE

## 2011-09-14 MED ORDER — AMOXICILLIN 500 MG PO CAPS: 500.0000 mg | ORAL_CAPSULE | Freq: Three times a day (TID) | ORAL | Status: AC

## 2011-09-14 NOTE — ED Notes (Signed)
Sore throat , onset this am.  

## 2011-09-15 NOTE — ED Provider Notes (Signed)
History     CSN: [id number]  Arrival date & time 09/14/11  1339   First MD Initiated Contact with Patient 09/14/11 1425      Chief Complaint  Patient presents with  . Sore Throat    (Consider location/radiation/quality/duration/timing/severity/associated sxs/prior treatment) Patient is a 31 y.o. female presenting with pharyngitis. The history is provided by the patient.  Sore Throat This is a new problem. Episode onset: several hours PTA. The problem occurs constantly. The problem has been unchanged. Associated symptoms include congestion and a sore throat. Pertinent negatives include no abdominal pain, arthralgias, chest pain, chills, coughing, fever, headaches, joint swelling, nausea, neck pain, numbness, rash, swollen glands, visual change, vomiting or weakness. The symptoms are aggravated by swallowing. She has tried nothing for the symptoms. The treatment provided no relief.    Past Medical History  Diagnosis Date  . Bronchitis   . Asthma     History reviewed. No pertinent past surgical history.  History reviewed. No pertinent family history.  History  Substance Use Topics  . Smoking status: Current Some Day Smoker  . Smokeless tobacco: Not on file  . Alcohol Use: Yes    OB History    Grav Para Term Preterm Abortions TAB SAB Ect Mult Living                  Review of Systems  Constitutional: Negative for fever, chills, activity change and appetite change.  HENT: Positive for congestion and sore throat. Negative for ear pain, facial swelling, trouble swallowing, neck pain, neck stiffness and voice change.   Eyes: Negative for pain and visual disturbance.  Respiratory: Negative for cough and shortness of breath.   Cardiovascular: Negative for chest pain.  Gastrointestinal: Negative for nausea, vomiting and abdominal pain.  Musculoskeletal: Negative for joint swelling and arthralgias.  Skin: Negative for color change and rash.  Neurological: Negative for  dizziness, facial asymmetry, speech difficulty, weakness, numbness and headaches.  Hematological: Negative for adenopathy.  All other systems reviewed and are negative.    Allergies  Prednisone  Home Medications   Current Outpatient Rx  Name Route Sig Dispense Refill  . ALBUTEROL SULFATE HFA 108 (90 BASE) MCG/ACT IN AERS Inhalation Inhale 2 puffs into the lungs every 4 (four) hours as needed for wheezing or shortness of breath (cough). 1 Inhaler 0  . CITALOPRAM HYDROBROMIDE 40 MG PO TABS Oral Take 40 mg by mouth every evening.     Marland Kitchen DIPHENHYDRAMINE HCL 25 MG PO CAPS Oral Take 25 mg by mouth daily as needed. For allergies    . AMOXICILLIN 500 MG PO CAPS Oral Take 1 capsule (500 mg total) by mouth 3 (three) times daily. 30 capsule 0    BP 116/89  Pulse 89  Temp 98.8 F (37.1 C) (Oral)  Resp 18  Ht 5\' 5"  (1.651 m)  Wt 135 lb (61.236 kg)  BMI 22.47 kg/m2  SpO2 100%  LMP 08/16/2011  Physical Exam  Nursing note and vitals reviewed. Constitutional: She is oriented to person, place, and time. She appears well-developed and well-nourished. No distress.  HENT:  Head: Normocephalic and atraumatic. No trismus in the jaw.  Right Ear: Tympanic membrane and ear canal normal.  Left Ear: Tympanic membrane and ear canal normal.  Mouth/Throat: Uvula is midline and mucous membranes are normal. No uvula swelling. Posterior oropharyngeal edema and posterior oropharyngeal erythema present. No oropharyngeal exudate or tonsillar abscesses.  Neck: Normal range of motion. Neck supple.  Cardiovascular: Normal rate, regular rhythm  and normal heart sounds.   Pulmonary/Chest: Effort normal and breath sounds normal.  Abdominal: Soft. She exhibits no distension. There is no tenderness.  Musculoskeletal: Normal range of motion. She exhibits no edema.  Lymphadenopathy:    She has no cervical adenopathy.  Neurological: She is alert and oriented to person, place, and time. She exhibits normal muscle tone.  Coordination normal.  Skin: Skin is warm and dry.    ED Course  Procedures (including critical care time)  Results for orders placed during the hospital encounter of 09/14/11  RAPID STREP SCREEN      Component Value Range   Streptococcus, Group A Screen (Direct) NEGATIVE  NEGATIVE      1. Pharyngitis       MDM   Patient is resting, NAD.  Non-toxic appearing.  Vitals stable.  mucous membranes are moist.  No meningeal signs.  Pt agrees to f/u with her PMD or to return here if her sx's worsen  The patient appears reasonably screened and/or stabilized for discharge and I doubt any other medical condition or other [facility] requiring further screening, evaluation, or treatment in the ED at this time prior to discharge.       Stelios Kirby L. [place], [place] 09/15/11 2224

## 2011-09-17 NOTE — ED Provider Notes (Signed)
Medical screening examination/treatment/procedure(s) were performed by non-physician practitioner and as supervising physician I was immediately available for consultation/collaboration.   Shelda Jakes, MD 09/17/11 1000

## 2011-10-10 ENCOUNTER — Emergency Department (HOSPITAL_COMMUNITY): Payer: Self-pay

## 2011-10-10 ENCOUNTER — Emergency Department (HOSPITAL_COMMUNITY)
Admission: EM | Admit: 2011-10-10 | Discharge: 2011-10-10 | Discharge status: Against Medical Advice | Payer: Self-pay | Attending: Emergency Medicine | Admitting: Emergency Medicine

## 2011-10-10 ENCOUNTER — Encounter (HOSPITAL_COMMUNITY): Payer: Self-pay | Admitting: *Deleted

## 2011-10-10 DIAGNOSIS — F17200 Nicotine dependence, unspecified, uncomplicated: Secondary | ICD-10-CM | POA: Insufficient documentation

## 2011-10-10 DIAGNOSIS — J45909 Unspecified asthma, uncomplicated: Secondary | ICD-10-CM | POA: Insufficient documentation

## 2011-10-10 DIAGNOSIS — Z79899 Other long term (current) drug therapy: Secondary | ICD-10-CM | POA: Insufficient documentation

## 2011-10-10 DIAGNOSIS — F329 Major depressive disorder, single episode, unspecified: Secondary | ICD-10-CM | POA: Insufficient documentation

## 2011-10-10 DIAGNOSIS — F445 Conversion disorder with seizures or convulsions: Secondary | ICD-10-CM

## 2011-10-10 DIAGNOSIS — F4310 Post-traumatic stress disorder, unspecified: Secondary | ICD-10-CM | POA: Insufficient documentation

## 2011-10-10 DIAGNOSIS — R569 Unspecified convulsions: Secondary | ICD-10-CM | POA: Insufficient documentation

## 2011-10-10 DIAGNOSIS — F3289 Other specified depressive episodes: Secondary | ICD-10-CM | POA: Insufficient documentation

## 2011-10-10 HISTORY — DX: Other psychoactive substance abuse, uncomplicated: F19.10

## 2011-10-10 HISTORY — DX: Unspecified convulsions: R56.9

## 2011-10-10 HISTORY — DX: Major depressive disorder, single episode, unspecified: F32.9

## 2011-10-10 HISTORY — DX: Depression, unspecified: F32.A

## 2011-10-10 HISTORY — DX: Post-traumatic stress disorder, unspecified: F43.10

## 2011-10-10 LAB — GLUCOSE, CAPILLARY: Glucose-Capillary: 66 mg/dL — ABNORMAL LOW (ref 70–99)

## 2011-10-10 MED ORDER — SODIUM CHLORIDE 0.9 % IV SOLN: INTRAVENOUS | Status: DC

## 2011-10-10 NOTE — ED Provider Notes (Signed)
History     CSN: [id number]  Arrival date & time 10/10/11  1412   First MD Initiated Contact with Patient 10/10/11 1421      Chief Complaint  Patient presents with  . Seizures     HPI Pt was seen at 1410.  Per EMS and pt report, pt c/o sudden onset and persistence of intermittent multiple "seizures" since last night.  EMS gave ativan IV en route with improvement.  EMS denies pt was incont of bowel/bladder, no post-ictal state, no AMS/confusion.  Pt endorses hx of seizures, "treated with xanax."  States she was "drinking some vodka" today.  States her seizures began after she was talking to a family member on the phone and "got upset."  Denies headache, no CP/SOB, no abd pain, no N/V/D, no fevers.      Past Medical History  Diagnosis Date  . Bronchitis   . Asthma   . Depression   . PTSD (post-traumatic stress disorder)   . 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 Polysubstance abuse     History reviewed. No pertinent past surgical history.   History  Substance Use Topics  . Smoking status: Current Every Day Smoker  . Smokeless tobacco: Not on file  . Alcohol Use: Yes     social      Review of Systems ROS: Statement: All systems negative except as marked or noted in the HPI; Constitutional: Negative for fever and chills. ; ; Eyes: Negative for eye pain, redness and discharge. ; ; ENMT: Negative for ear pain, hoarseness, nasal congestion, sinus pressure and sore throat. ; ; Cardiovascular: Negative for chest pain, palpitations, diaphoresis, dyspnea and peripheral edema. ; ; Respiratory: Negative for cough, wheezing and stridor. ; ; Gastrointestinal: Negative for nausea, vomiting, diarrhea, abdominal pain, blood in stool, hematemesis, jaundice and rectal bleeding. . ; ; Genitourinary: Negative for dysuria, flank pain and hematuria. ; ; Musculoskeletal: Negative for back pain and neck pain. Negative for swelling and trauma.; ; Skin:  Negative for pruritus, rash, abrasions, blisters, bruising and skin lesion.; ; Neuro: Negative for headache, lightheadedness and neck stiffness. Negative for weakness, altered level of consciousness , altered mental status, extremity weakness, paresthesias, involuntary movement, +seizure.; Psych:  No SI, no SA, no HI, no hallucinations.     Allergies  Prednisone  Home Medications   Current Outpatient Rx  Name Route Sig Dispense Refill  . ALBUTEROL SULFATE HFA 108 (90 BASE) MCG/ACT IN AERS Inhalation Inhale 2 puffs into the lungs every 4 (four) hours as needed for wheezing or shortness of breath (cough). 1 Inhaler 0  . CITALOPRAM HYDROBROMIDE 40 MG PO TABS Oral Take 40 mg by mouth every evening.     Marland Kitchen DIPHENHYDRAMINE HCL 25 MG PO CAPS Oral Take 25 mg by mouth daily as needed. For allergies      BP 130/86  Temp 98.2 F (36.8 C) (Oral)  Resp 20  Ht 5\' 6"  (1.676 m)  Wt 130 lb (58.968 kg)  BMI 20.98 kg/m2  SpO2 95%  LMP 10/03/2011  Physical Exam 1415: Physical examination:  Nursing notes reviewed; Vital signs and O2 SAT reviewed;  Constitutional: Well developed, Well nourished, Well hydrated, In no acute distress; Head:  Normocephalic, atraumatic; Eyes: EOMI, PERRL, No scleral icterus; ENMT: Mouth and pharynx normal, Mucous membranes moist; Neck: Supple, Full range of motion, No lymphadenopathy; Cardiovascular: Regular rate and rhythm, No murmur, rub, or gallop; Respiratory: Breath sounds clear &  equal bilaterally, No rales, rhonchi, wheezes.  Speaking full sentences with ease, Normal respiratory effort/excursion; Chest: Nontender, Movement normal; Abdomen: Soft, Nontender, Nondistended, Normal bowel sounds;; Extremities: Pulses normal, No tenderness, No edema, No calf edema or asymmetry.; Neuro: AA&Ox3, Major CN grossly intact.  Speech clear. No gross focal motor or sensory deficits in extremities.; Skin: Color normal, Warm, Dry.   ED Course  Procedures   1420:  Pt arrived to ED exam  room with "generalized seizure activity" but quickly awoke to A&O when moved from EMS stretcher to ED stretcher.  Pt initially was sitting up on stretcher, helping staff assist her into her gown, talking with ED staff, NAD, resps easy; when she suddenly became "unresponsive" and shook her arms and legs on the stretcher while making moaning sounds.  Pt awoke again easily with tactile stimuli, A&O.  No confusion, no post-ictal state, no incont of bowel or bladder.  Pt immediately began talking to [place] and answering staff's questions.    MDM  MDM Reviewed: nursing note, vitals and previous chart      1530:  Pt refuses all dx testing.  Pt has climbed off the stretcher by herself, pulled out her IV and "wants to leave now."  Pt encouraged to stay.  Continues to refuse, stating she has already called for her ride home.  Pt ambulatory to the bathroom with steady gait, easy resps.  States she "needs a refill of my seizure meds," but cannot recall the name only "that it's like xanax."  [name] pharmacy called to verify pt's statements, Pharm Tech states no rx on file for "seizure medicines."  Pt continues to want to leave, even after her significant other arrived.  Pt and family informed re: need for dx testing for further evaluation.  Pt refuses any dx testing.  I encouraged pt to stay, continues to refuse.  Pt makes her own medical decisions.  Risks of AMA explained to pt and family, including, but not limited to:  stroke, heart attack, cardiac arrythmia ("irregular heart rate/beat"), "passing out," temporary and/or permanent disability, death.  Pt and family verb understanding and continue to refuse evaluation, understanding the consequences of their decision.  I encouraged pt to follow up with her PMD tomorrow and return to the ED immediately if symptoms return, or for any other concerns.  Pt and family verb understanding, agreeable.        Laray Anger, DO 10/12/11 1222

## 2011-10-10 NOTE — ED Notes (Signed)
Pt reports drinking 1 pint of vodka today.

## 2011-10-10 NOTE — ED Notes (Signed)
POCT CBG result of 66.
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
## 2011-10-10 NOTE — ED Notes (Signed)
Pt removed IV, requesting to leave AMA.  edp notified and at bedside.  Explained to pt that she was too intoxicated to walk out.  She would be able to leave after a ride arrives.  Verbalized understanding.  Pt up to bathroom, standing in front of bathroom, pt became unresponsive.  RN assisted pt to w/c and seizure like movements began lasting approx 15 secs.  Pt immediately alert and oriented after movements ended.  Assisted pt back to bed.  [name] fiance arrived.  Pt still requesting to leave AMA.  Informed pt of risks and benefits of leaving/staying for treatment.  Pt verbalized understanding stating, " I have a prescription for seizure medicine at [facility] I just have to get filled."  Pharmacy tech called to verify, pt has no Rx for seizures on file at [facility].  Pt left AMA with fiance, walked out of dept, refusing wheelchair.  Explained to fiance that if he needed to call 911 to please do so and bring her back to ED.  Fiance verbalized understanding.

## 2011-10-10 NOTE — ED Notes (Signed)
Per EMS - pt had "10 seizure like episodes" last night.  EMS on scene then, pt refused treatment.  Seizure like activity began again today.  EMS gave 4mg  total Ativan en route IV.  Pt drowsy, non postictal, alert and oriented x 4 at this time.  Pt has ETOH on board.

## 2011-10-13 ENCOUNTER — Encounter (HOSPITAL_COMMUNITY): Payer: Self-pay | Admitting: *Deleted

## 2011-10-13 ENCOUNTER — Emergency Department (HOSPITAL_COMMUNITY)
Admission: EM | Admit: 2011-10-13 | Discharge: 2011-10-13 | Disposition: A | Discharge status: Home / Self Care | Payer: Self-pay | Attending: Emergency Medicine | Admitting: Emergency Medicine

## 2011-10-13 ENCOUNTER — Emergency Department (HOSPITAL_COMMUNITY): Payer: Self-pay

## 2011-10-13 DIAGNOSIS — F1910 Other psychoactive substance abuse, uncomplicated: Secondary | ICD-10-CM | POA: Insufficient documentation

## 2011-10-13 DIAGNOSIS — J45909 Unspecified asthma, uncomplicated: Secondary | ICD-10-CM | POA: Insufficient documentation

## 2011-10-13 DIAGNOSIS — F19939 Other psychoactive substance use, unspecified with withdrawal, unspecified: Secondary | ICD-10-CM | POA: Insufficient documentation

## 2011-10-13 DIAGNOSIS — F19239 Other psychoactive substance dependence with withdrawal, unspecified: Secondary | ICD-10-CM

## 2011-10-13 LAB — CBC WITH DIFFERENTIAL/PLATELET
Basophils Absolute: 0 10*3/uL (ref 0.0–0.1)
Basophils Relative: 0 % (ref 0–1)
MCHC: 32.7 g/dL (ref 30.0–36.0)
Neutro Abs: 5.5 10*3/uL (ref 1.7–7.7)
Neutrophils Relative %: 57 % (ref 43–77)
RDW: 14.1 % (ref 11.5–15.5)

## 2011-10-13 LAB — COMPREHENSIVE METABOLIC PANEL
Albumin: 4.1 g/dL (ref 3.5–5.2)
Alkaline Phosphatase: 116 U/L (ref 39–117)
BUN: 8 mg/dL (ref 6–23)
Chloride: 102 mEq/L (ref 96–112)
GFR calc Af Amer: >90 mL/min (ref >90)
Glucose, Bld: 87 mg/dL (ref 70–99)
Potassium: 3.7 mEq/L (ref 3.5–5.1)
Total Bilirubin: 0.5 mg/dL (ref 0.3–1.2)

## 2011-10-13 LAB — RAPID URINE DRUG SCREEN, HOSP PERFORMED
Cocaine: NOT DETECTED
Opiates: NOT DETECTED

## 2011-10-13 MED ORDER — FOLIC ACID 1 MG PO TABS
1.0000 mg | ORAL_TABLET | Freq: Every day | ORAL | Status: DC
  Administered 2011-10-13: 1 mg via ORAL
  Filled 2011-10-13: qty 1

## 2011-10-13 MED ORDER — SODIUM CHLORIDE 0.9 % IV BOLUS (SEPSIS)
1000.0000 mL | Freq: Once | INTRAVENOUS | Status: AC
  Administered 2011-10-13: 1000 mL via INTRAVENOUS

## 2011-10-13 MED ORDER — ADULT MULTIVITAMIN W/MINERALS CH
1.0000 | ORAL_TABLET | Freq: Every day | ORAL | Status: DC
  Administered 2011-10-13: 1 via ORAL
  Filled 2011-10-13: qty 1

## 2011-10-13 MED ORDER — VITAMIN B-1 100 MG PO TABS
100.0000 mg | ORAL_TABLET | Freq: Every day | ORAL | Status: DC
  Administered 2011-10-13: 100 mg via ORAL
  Filled 2011-10-13: qty 1

## 2011-10-13 MED ORDER — THIAMINE HCL 100 MG/ML IJ SOLN: 100.0000 mg | Freq: Every day | INTRAMUSCULAR | Status: DC

## 2011-10-13 NOTE — ED Notes (Signed)
Pt states she has been having seizures because she has been out of xanax x 5 days. Last seizure activity was last night.

## 2011-10-13 NOTE — ED Provider Notes (Signed)
History     CSN: [id number]  Arrival date & time 10/13/11  1122   First MD Initiated Contact with Patient 10/13/11 1335      Chief Complaint  Patient presents with  . Seizures    (Consider location/radiation/quality/duration/timing/severity/associated sxs/prior treatment) HPI  Patient complaining of seizures since running out of xanax and quitting drinking earlier this week.  She and her fiancee report grand mal seizures multiple on Wednesday, Thursday, and Friday.  Patient had ativan on Thursday by ems and transported by ems and left ama.  Friday patient took one milligram of xanax last night from "street" and drank alcohol.  No seizures since.  Patient states she wants to be sure that seizures are not from something else but is unsure if she wants hospitalization for detox.  Patient states she has had wd seizures several times in past.  Bruises to back and extremities.    Past Medical History  Diagnosis Date  . Bronchitis   . Asthma   . Depression   . PTSD (post-traumatic stress disorder)   . 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 Polysubstance abuse     History reviewed. No pertinent past surgical history.  No family history on file.  History  Substance Use Topics  . Smoking status: Current Every Day Smoker  . Smokeless tobacco: Not on file  . Alcohol Use: Yes     social    OB History    Grav Para Term Preterm Abortions TAB SAB Ect Mult Living                  Review of Systems  Constitutional: Negative for fever, chills, activity change, appetite change and unexpected weight change.  HENT: Negative for sore throat, rhinorrhea, neck pain, neck stiffness and sinus pressure.   Eyes: Negative for visual disturbance.  Respiratory: Negative for cough and shortness of breath.   Cardiovascular: Negative for chest pain and leg swelling.  Gastrointestinal: Negative for vomiting, abdominal pain, diarrhea and blood in stool.    Genitourinary: Negative for dysuria, urgency, frequency, vaginal discharge, difficulty urinating and pelvic pain.  Musculoskeletal: Negative for myalgias, arthralgias and gait problem.  Skin: Negative for color change and rash.  Neurological: Negative for weakness, light-headedness and headaches.  Hematological: Does not bruise/bleed easily.  Psychiatric/Behavioral: Negative for dysphoric mood.    Allergies  Prednisone  Home Medications   Current Outpatient Rx  Name Route Sig Dispense Refill  . CITALOPRAM HYDROBROMIDE 40 MG PO TABS Oral Take 40 mg by mouth every evening.       BP 128/95  Pulse 66  Temp 97.7 F (36.5 C) (Oral)  Resp 16  Ht 5\' 6"  (1.676 m)  Wt 135 lb (61.236 kg)  BMI 21.79 kg/m2  SpO2 100%  LMP 10/03/2011  Physical Exam  ED Course  Procedures (including critical care time)   Labs Reviewed  CBC WITH DIFFERENTIAL  PREGNANCY, URINE  ETHANOL  URINE RAPID DRUG SCREEN (HOSP PERFORMED)  COMPREHENSIVE METABOLIC PANEL   No results found.   No diagnosis found.    MDM  Patient advised of continued risk of withdrawal seizures as she had etoh and xanax last night.  She is offered inpatient detox but does not with it at this time.  She is advised of seizure precautions and agrees to compliance.       Hilario Quarry, MD 10/13/11 [phone]

## 2011-10-20 ENCOUNTER — Emergency Department (HOSPITAL_COMMUNITY)
Admission: EM | Admit: 2011-10-20 | Discharge: 2011-10-20 | Discharge status: Against Medical Advice | Payer: Self-pay | Attending: Emergency Medicine | Admitting: Emergency Medicine

## 2011-10-20 ENCOUNTER — Encounter (HOSPITAL_COMMUNITY): Payer: Self-pay

## 2011-10-20 DIAGNOSIS — J029 Acute pharyngitis, unspecified: Secondary | ICD-10-CM | POA: Insufficient documentation

## 2011-10-20 DIAGNOSIS — N898 Other specified noninflammatory disorders of vagina: Secondary | ICD-10-CM | POA: Insufficient documentation

## 2011-10-20 HISTORY — DX: Acute vaginitis: N76.0

## 2011-10-20 HISTORY — DX: Other specified bacterial agents as the cause of diseases classified elsewhere: B96.89

## 2011-10-20 NOTE — ED Notes (Signed)
Pt c/o " vaginal bacterial infection" and sore throat. Pt states she has odor and discharge x3 days. Pt denies and pain or discomfort. Pt states she's had similar symptoms in the past and was tx with abx.

## 2011-10-20 NOTE — ED Notes (Signed)
1. Pt stated "i got a bacterial infection", having yellow vaginal discharge for 3 days.  2. Sore  Throat since last night

## 2011-11-19 ENCOUNTER — Encounter (HOSPITAL_COMMUNITY): Payer: Self-pay | Admitting: Emergency Medicine

## 2011-11-19 ENCOUNTER — Emergency Department (HOSPITAL_COMMUNITY)
Admission: EM | Admit: 2011-11-19 | Discharge: 2011-11-20 | Disposition: A | Discharge status: Home / Self Care | Payer: Self-pay | Attending: Emergency Medicine | Admitting: Emergency Medicine

## 2011-11-19 DIAGNOSIS — F1210 Cannabis abuse, uncomplicated: Secondary | ICD-10-CM | POA: Insufficient documentation

## 2011-11-19 DIAGNOSIS — F17200 Nicotine dependence, unspecified, uncomplicated: Secondary | ICD-10-CM | POA: Insufficient documentation

## 2011-11-19 DIAGNOSIS — R45851 Suicidal ideations: Secondary | ICD-10-CM | POA: Insufficient documentation

## 2011-11-19 DIAGNOSIS — F1410 Cocaine abuse, uncomplicated: Secondary | ICD-10-CM | POA: Insufficient documentation

## 2011-11-19 DIAGNOSIS — Z87448 Personal history of other diseases of urinary system: Secondary | ICD-10-CM | POA: Insufficient documentation

## 2011-11-19 DIAGNOSIS — F329 Major depressive disorder, single episode, unspecified: Secondary | ICD-10-CM | POA: Insufficient documentation

## 2011-11-19 DIAGNOSIS — F4310 Post-traumatic stress disorder, unspecified: Secondary | ICD-10-CM | POA: Insufficient documentation

## 2011-11-19 DIAGNOSIS — F3289 Other specified depressive episodes: Secondary | ICD-10-CM | POA: Insufficient documentation

## 2011-11-19 DIAGNOSIS — J45909 Unspecified asthma, uncomplicated: Secondary | ICD-10-CM | POA: Insufficient documentation

## 2011-11-19 LAB — ACETAMINOPHEN LEVEL: Acetaminophen (Tylenol), Serum: <15 ug/mL (ref 10–30)

## 2011-11-19 LAB — COMPREHENSIVE METABOLIC PANEL
AST: 22 U/L (ref 0–37)
Albumin: 4.2 g/dL (ref 3.5–5.2)
Chloride: 109 mEq/L (ref 96–112)
Creatinine, Ser: 0.86 mg/dL (ref 0.50–1.10)
Total Bilirubin: 0.5 mg/dL (ref 0.3–1.2)
Total Protein: 7.6 g/dL (ref 6.0–8.3)

## 2011-11-19 LAB — RAPID URINE DRUG SCREEN, HOSP PERFORMED
Benzodiazepines: POSITIVE — AB
Cocaine: NOT DETECTED
Opiates: NOT DETECTED

## 2011-11-19 LAB — CBC
MCV: 93.5 fL (ref 78.0–100.0)
Platelets: 351 10*3/uL (ref 150–400)
RDW: 13.6 % (ref 11.5–15.5)
WBC: 12.4 10*3/uL — ABNORMAL HIGH (ref 4.0–10.5)

## 2011-11-19 LAB — SALICYLATE LEVEL: Salicylate Lvl: <2 mg/dL — ABNORMAL LOW (ref 2.8–20.0)

## 2011-11-19 MED ORDER — LORAZEPAM 1 MG PO TABS: 1.0000 mg | ORAL_TABLET | Freq: Three times a day (TID) | ORAL | Status: DC | PRN

## 2011-11-19 MED ORDER — CITALOPRAM HYDROBROMIDE 10 MG PO TABS
40.0000 mg | ORAL_TABLET | Freq: Every evening | ORAL | Status: DC
  Administered 2011-11-20 (×2): 40 mg via ORAL
  Filled 2011-11-19: qty 4

## 2011-11-19 MED ORDER — IBUPROFEN 200 MG PO TABS
600.0000 mg | ORAL_TABLET | Freq: Four times a day (QID) | ORAL | Status: DC | PRN
  Administered 2011-11-20: 600 mg via ORAL
  Filled 2011-11-19: qty 3

## 2011-11-19 NOTE — ED Notes (Signed)
pts belongings at nurses station, will not fit in locker.

## 2011-11-19 NOTE — ED Notes (Signed)
Pt here with family requesting detox from ETOH and xanax; pt sts drank 1 pint today and took 4 mg xanax just prior to arrival; pt appears drowsy and impaired at present; pt calm and cooperative
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
## 2011-11-20 MED ORDER — ALPRAZOLAM 0.5 MG PO TABS
2.0000 mg | ORAL_TABLET | Freq: Every day | ORAL | Status: DC
  Administered 2011-11-20: 2 mg via ORAL
  Filled 2011-11-20: qty 4

## 2011-11-20 NOTE — ED Notes (Signed)
Mother called requesting update on patient's care. Phone give to patient to update mother.
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
## 2011-11-20 NOTE — ED Notes (Signed)
Pts belongings inventoried and locked up.

## 2011-11-20 NOTE — BH Assessment (Signed)
[facility] Assessment Progress Note    Patient was seen by Dr. Jacky Kindle with telepsychiatry.  Patient has been deemed appropriate for discharge and revocation of IVC papers.  Patient will be discharged home with information on substance abuse treatment care in this area.

## 2012-03-01 ENCOUNTER — Other Ambulatory Visit: Payer: Self-pay

## 2012-05-14 ENCOUNTER — Emergency Department (HOSPITAL_COMMUNITY)
Admission: EM | Admit: 2012-05-14 | Discharge: 2012-05-15 | Disposition: A | Discharge status: Home / Self Care | Payer: Self-pay | Attending: Emergency Medicine | Admitting: Emergency Medicine

## 2012-05-14 ENCOUNTER — Encounter (HOSPITAL_COMMUNITY): Payer: Self-pay | Admitting: *Deleted

## 2012-05-14 ENCOUNTER — Emergency Department (HOSPITAL_COMMUNITY): Payer: Self-pay

## 2012-05-14 DIAGNOSIS — Z8709 Personal history of other diseases of the respiratory system: Secondary | ICD-10-CM | POA: Insufficient documentation

## 2012-05-14 DIAGNOSIS — Z8619 Personal history of other infectious and parasitic diseases: Secondary | ICD-10-CM | POA: Insufficient documentation

## 2012-05-14 DIAGNOSIS — S0990XA Unspecified injury of head, initial encounter: Secondary | ICD-10-CM | POA: Insufficient documentation

## 2012-05-14 DIAGNOSIS — F3289 Other specified depressive episodes: Secondary | ICD-10-CM | POA: Insufficient documentation

## 2012-05-14 DIAGNOSIS — S59909A Unspecified injury of unspecified elbow, initial encounter: Secondary | ICD-10-CM | POA: Insufficient documentation

## 2012-05-14 DIAGNOSIS — F17200 Nicotine dependence, unspecified, uncomplicated: Secondary | ICD-10-CM | POA: Insufficient documentation

## 2012-05-14 DIAGNOSIS — J45909 Unspecified asthma, uncomplicated: Secondary | ICD-10-CM | POA: Insufficient documentation

## 2012-05-14 DIAGNOSIS — F4310 Post-traumatic stress disorder, unspecified: Secondary | ICD-10-CM | POA: Insufficient documentation

## 2012-05-14 DIAGNOSIS — F329 Major depressive disorder, single episode, unspecified: Secondary | ICD-10-CM | POA: Insufficient documentation

## 2012-05-14 DIAGNOSIS — R569 Unspecified convulsions: Secondary | ICD-10-CM | POA: Insufficient documentation

## 2012-05-14 DIAGNOSIS — S6990XA Unspecified injury of unspecified wrist, hand and finger(s), initial encounter: Secondary | ICD-10-CM | POA: Insufficient documentation

## 2012-05-14 MED ORDER — ACETAMINOPHEN 500 MG PO TABS
1000.0000 mg | ORAL_TABLET | Freq: Once | ORAL | Status: DC
  Filled 2012-05-14: qty 2
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
## 2012-05-15 LAB — RAPID URINE DRUG SCREEN, HOSP PERFORMED
Benzodiazepines: POSITIVE — AB
Opiates: NOT DETECTED

## 2012-05-15 LAB — URINALYSIS, ROUTINE W REFLEX MICROSCOPIC
Glucose, UA: NEGATIVE mg/dL
Ketones, ur: NEGATIVE mg/dL
Leukocytes, UA: NEGATIVE
pH: 6 (ref 5.0–8.0)

## 2012-05-15 LAB — URINE MICROSCOPIC-ADD ON

## 2012-05-15 LAB — ETHANOL: Alcohol, Ethyl (B): 184 mg/dL — ABNORMAL HIGH (ref 0–11)

## 2012-05-15 NOTE — ED Notes (Signed)
Pt requesting something to drink.  Also requesting medication for headache.  Offered previously refused medication, but pt declined.

## 2012-05-15 NOTE — ED Notes (Signed)
Pt ambulating independently.  Gait steady.  Pt slightly upset that she is unable to leave department to go outside and smoke.  Explained to pt that leaving prior to discharge equates to leaving department AMA.

## 2012-05-15 NOTE — ED Notes (Signed)
Pt refused Tylenol for pain.  Explained to pt that there for no orders for additional medications.  Pt continued to refuse.

## 2012-09-03 ENCOUNTER — Emergency Department (HOSPITAL_COMMUNITY): Admission: EM | Admit: 2012-09-03 | Discharge: 2012-09-03 | Discharge status: Against Medical Advice | Payer: Self-pay

## 2012-11-20 ENCOUNTER — Other Ambulatory Visit: Payer: Self-pay

## 2013-01-16 ENCOUNTER — Encounter (HOSPITAL_COMMUNITY): Payer: Self-pay | Admitting: Emergency Medicine

## 2013-01-16 ENCOUNTER — Emergency Department (HOSPITAL_COMMUNITY)
Admission: EM | Admit: 2013-01-16 | Discharge: 2013-01-17 | Disposition: A | Discharge status: Home / Self Care | Payer: Self-pay | Attending: Emergency Medicine | Admitting: Emergency Medicine

## 2013-01-16 DIAGNOSIS — Z87440 Personal history of urinary (tract) infections: Secondary | ICD-10-CM | POA: Insufficient documentation

## 2013-01-16 DIAGNOSIS — F3289 Other specified depressive episodes: Secondary | ICD-10-CM | POA: Insufficient documentation

## 2013-01-16 DIAGNOSIS — F4310 Post-traumatic stress disorder, unspecified: Secondary | ICD-10-CM | POA: Insufficient documentation

## 2013-01-16 DIAGNOSIS — F10929 Alcohol use, unspecified with intoxication, unspecified: Secondary | ICD-10-CM

## 2013-01-16 DIAGNOSIS — F1910 Other psychoactive substance abuse, uncomplicated: Secondary | ICD-10-CM

## 2013-01-16 DIAGNOSIS — Z3202 Encounter for pregnancy test, result negative: Secondary | ICD-10-CM | POA: Insufficient documentation

## 2013-01-16 DIAGNOSIS — R42 Dizziness and giddiness: Secondary | ICD-10-CM | POA: Insufficient documentation

## 2013-01-16 DIAGNOSIS — R259 Unspecified abnormal involuntary movements: Secondary | ICD-10-CM | POA: Insufficient documentation

## 2013-01-16 DIAGNOSIS — J45909 Unspecified asthma, uncomplicated: Secondary | ICD-10-CM | POA: Insufficient documentation

## 2013-01-16 DIAGNOSIS — Z8669 Personal history of other diseases of the nervous system and sense organs: Secondary | ICD-10-CM | POA: Insufficient documentation

## 2013-01-16 DIAGNOSIS — F1210 Cannabis abuse, uncomplicated: Secondary | ICD-10-CM | POA: Insufficient documentation

## 2013-01-16 DIAGNOSIS — F329 Major depressive disorder, single episode, unspecified: Secondary | ICD-10-CM | POA: Insufficient documentation

## 2013-01-16 DIAGNOSIS — F1010 Alcohol abuse, uncomplicated: Secondary | ICD-10-CM | POA: Insufficient documentation

## 2013-01-16 DIAGNOSIS — Z79899 Other long term (current) drug therapy: Secondary | ICD-10-CM | POA: Insufficient documentation

## 2013-01-16 DIAGNOSIS — F17200 Nicotine dependence, unspecified, uncomplicated: Secondary | ICD-10-CM | POA: Insufficient documentation

## 2013-01-16 DIAGNOSIS — F1310 Sedative, hypnotic or anxiolytic abuse, uncomplicated: Secondary | ICD-10-CM | POA: Insufficient documentation

## 2013-01-16 LAB — COMPREHENSIVE METABOLIC PANEL
ALK PHOS: 106 U/L (ref 39–117)
ALT: 8 U/L (ref 0–35)
AST: 16 U/L (ref 0–37)
Albumin: 4.4 g/dL (ref 3.5–5.2)
BILIRUBIN TOTAL: 0.2 mg/dL — AB (ref 0.3–1.2)
BUN: 7 mg/dL (ref 6–23)
CHLORIDE: 109 meq/L (ref 96–112)
CO2: 26 meq/L (ref 19–32)
CREATININE: 0.79 mg/dL (ref 0.50–1.10)
Calcium: 10.1 mg/dL (ref 8.4–10.5)
GLUCOSE: 72 mg/dL (ref 70–99)
POTASSIUM: 3.2 meq/L — AB (ref 3.7–5.3)
Sodium: 147 mEq/L (ref 137–147)
Total Protein: 7.7 g/dL (ref 6.0–8.3)

## 2013-01-16 LAB — CBC WITH DIFFERENTIAL/PLATELET
BASOS PCT: 0 % (ref 0–1)
Basophils Absolute: 0.1 10*3/uL (ref 0.0–0.1)
Eosinophils Absolute: 0.1 10*3/uL (ref 0.0–0.7)
Eosinophils Relative: 1 % (ref 0–5)
HEMATOCRIT: 46.3 % — AB (ref 36.0–46.0)
HEMOGLOBIN: 15.8 g/dL — AB (ref 12.0–15.0)
LYMPHS ABS: 4.7 10*3/uL — AB (ref 0.7–4.0)
LYMPHS PCT: 34 % (ref 12–46)
MCH: 31.2 pg (ref 26.0–34.0)
MCHC: 34.1 g/dL (ref 30.0–36.0)
MCV: 91.5 fL (ref 78.0–100.0)
MONO ABS: 1.4 10*3/uL — AB (ref 0.1–1.0)
MONOS PCT: 10 % (ref 3–12)
NEUTROS ABS: 7.8 10*3/uL — AB (ref 1.7–7.7)
NEUTROS PCT: 55 % (ref 43–77)
Platelets: 333 10*3/uL (ref 150–400)
RBC: 5.06 MIL/uL (ref 3.87–5.11)
RDW: 13.7 % (ref 11.5–15.5)
WBC: 14 10*3/uL — ABNORMAL HIGH (ref 4.0–10.5)

## 2013-01-16 LAB — URINALYSIS, ROUTINE W REFLEX MICROSCOPIC
Bilirubin Urine: NEGATIVE
GLUCOSE, UA: NEGATIVE mg/dL
Ketones, ur: NEGATIVE mg/dL
LEUKOCYTES UA: NEGATIVE
Nitrite: NEGATIVE
PROTEIN: NEGATIVE mg/dL
UROBILINOGEN UA: 0.2 mg/dL (ref 0.0–1.0)
pH: 6 (ref 5.0–8.0)

## 2013-01-16 LAB — SALICYLATE LEVEL

## 2013-01-16 LAB — RAPID URINE DRUG SCREEN, HOSP PERFORMED
AMPHETAMINES: NOT DETECTED
Barbiturates: NOT DETECTED
Benzodiazepines: POSITIVE — AB
Cocaine: NOT DETECTED
OPIATES: NOT DETECTED
Tetrahydrocannabinol: POSITIVE — AB

## 2013-01-16 LAB — URINE MICROSCOPIC-ADD ON

## 2013-01-16 LAB — ETHANOL: ALCOHOL ETHYL (B): 310 mg/dL — AB (ref 0–11)

## 2013-01-16 LAB — PREGNANCY, URINE: PREG TEST UR: NEGATIVE

## 2013-01-16 LAB — ACETAMINOPHEN LEVEL: Acetaminophen (Tylenol), Serum: <15 ug/mL (ref 10–30)

## 2013-01-16 MED ORDER — ZIPRASIDONE MESYLATE 20 MG IM SOLR
INTRAMUSCULAR | Status: AC
  Administered 2013-01-16: 10 mg
  Filled 2013-01-16: qty 20

## 2013-01-16 MED ORDER — STERILE WATER FOR INJECTION IJ SOLN
INTRAMUSCULAR | Status: AC
  Administered 2013-01-16: 1.2 mL
  Filled 2013-01-16: qty 10
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
## 2013-01-16 NOTE — ED Provider Notes (Signed)
CSN: [id number]     Arrival date & time 01/16/13  2104 History   First MD Initiated Contact with Patient 01/16/13 2320     Chief Complaint  Patient presents with  . Alcohol Intoxication   (Consider location/radiation/quality/duration/timing/severity/associated sxs/prior Treatment) HPI Level 5 Caveat: intoxicated; combative. This is a 33 year old female with a history of seizures or pseudoseizures and polysubstance abuse. She was brought by EMS after she "passed out" after drinking a considerable amount of alcohol. She may also have taken an unspecified amount of Xanax. She was noted to have shaking episodes are brief and followed by spinning. Arrival in the ED she was combative and uncooperative. She was handcuffed by sheriff's deputies and given 10 mg of IM Geodon prior to my evaluation.  Past Medical History  Diagnosis Date  . Bronchitis   . Asthma   . Depression   . PTSD (post-traumatic stress disorder)   . 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 Polysubstance abuse   . Bacterial vaginosis    Past Surgical History  Procedure Laterality Date  . Cyst removed      from forehead   No family history on file. History  Substance Use Topics  . Smoking status: Current Every Day Smoker    Types: Cigarettes  . Smokeless tobacco: Not on file  . Alcohol Use: Yes     Comment: social   OB History   Grav Para Term Preterm Abortions TAB SAB Ect Mult Living                 Review of Systems  Unable to perform ROS   Allergies  Prednisone  Home Medications   Current Outpatient Rx  Name  Route  Sig  Dispense  Refill  . ALPRAZolam (XANAX) 1 MG tablet   Oral   Take 2 mg by mouth daily.         . citalopram (CELEXA) 40 MG tablet   Oral   Take 40 mg by mouth every evening.          Marland Kitchen ibuprofen (ADVIL,MOTRIN) 200 MG tablet   Oral   Take 600 mg by mouth every 6 (six) hours as needed. For pain          BP 124/50  Pulse 88   Temp(Src) 97.7 F (36.5 C) (Oral)  Resp 16  SpO2 98% Physical Exam General: Well-developed, well-nourished female in no acute distress; appearance consistent with age of record; handcuffed to gurney HENT: normocephalic; atraumatic Eyes: pupils equal, round and reactive to light Neck: supple Heart: regular rate and rhythm; no murmurs, rubs or gallops Lungs: clear to auscultation bilaterally Abdomen: soft; nondistended; nontender; no masses or hepatosplenomegaly; bowel sounds present Extremities: No deformity; full range of motion; pulses normal Neurologic: Appears intoxicated; noted to move all extremities Skin: Warm and dry Psychiatric: Agitated; uncooperative; combative    ED Course  Procedures (including critical care time)   MDM   Nursing notes and vitals signs, including pulse oximetry, reviewed.  Summary of this visit's results, reviewed by myself:  Labs:  Results for orders placed during the hospital encounter of 01/16/13 (from the past 24 hour(s))  CBC WITH DIFFERENTIAL     Status: Abnormal   Collection Time    01/16/13  9:30 PM      Result Value Range   WBC 14.0 (*) 4.0 - 10.5 K/uL   RBC 5.06  3.87 - 5.11 MIL/uL   Hemoglobin 15.8 (*)  12.0 - 15.0 g/dL   HCT 04.546.3 (*) 40.936.0 - 81.146.0 %   MCV 91.5  78.0 - 100.0 fL   MCH 31.2  26.0 - 34.0 pg   MCHC 34.1  30.0 - 36.0 g/dL   RDW 91.413.7  78.211.5 - 95.615.5 %   Platelets 333  150 - 400 K/uL   Neutrophils Relative % 55  43 - 77 %   Neutro Abs 7.8 (*) 1.7 - 7.7 K/uL   Lymphocytes Relative 34  12 - 46 %   Lymphs Abs 4.7 (*) 0.7 - 4.0 K/uL   Monocytes Relative 10  3 - 12 %   Monocytes Absolute 1.4 (*) 0.1 - 1.0 K/uL   Eosinophils Relative 1  0 - 5 %   Eosinophils Absolute 0.1  0.0 - 0.7 K/uL   Basophils Relative 0  0 - 1 %   Basophils Absolute 0.1  0.0 - 0.1 K/uL  COMPREHENSIVE METABOLIC PANEL     Status: Abnormal   Collection Time    01/16/13  9:30 PM      Result Value Range   Sodium 147  137 - 147 mEq/L   Potassium 3.2 (*)  3.7 - 5.3 mEq/L   Chloride 109  96 - 112 mEq/L   CO2 26  19 - 32 mEq/L   Glucose, Bld 72  70 - 99 mg/dL   BUN 7  6 - 23 mg/dL   Creatinine, Ser 2.130.79  0.50 - 1.10 mg/dL   Calcium 08.610.1  8.4 - 57.810.5 mg/dL   Total Protein 7.7  6.0 - 8.3 g/dL   Albumin 4.4  3.5 - 5.2 g/dL   AST 16  0 - 37 U/L   ALT 8  0 - 35 U/L   Alkaline Phosphatase 106  39 - 117 U/L   Total Bilirubin 0.2 (*) 0.3 - 1.2 mg/dL   GFR calc non Af Amer >90  >90 mL/min   GFR calc Af Amer >90  >90 mL/min  ETHANOL     Status: Abnormal   Collection Time    01/16/13  9:30 PM      Result Value Range   Alcohol, Ethyl (B) 310 (*) 0 - 11 mg/dL  SALICYLATE LEVEL     Status: Abnormal   Collection Time    01/16/13  9:30 PM      Result Value Range   Salicylate Lvl <2.0 (*) 2.8 - 20.0 mg/dL  ACETAMINOPHEN LEVEL     Status: None   Collection Time    01/16/13  9:30 PM      Result Value Range   Acetaminophen (Tylenol), Serum <15.0  10 - 30 ug/mL  URINE RAPID DRUG SCREEN (HOSP PERFORMED)     Status: Abnormal   Collection Time    01/16/13 10:01 PM      Result Value Range   Opiates NONE DETECTED  NONE DETECTED   Cocaine NONE DETECTED  NONE DETECTED   Benzodiazepines POSITIVE (*) NONE DETECTED   Amphetamines NONE DETECTED  NONE DETECTED   Tetrahydrocannabinol POSITIVE (*) NONE DETECTED   Barbiturates NONE DETECTED  NONE DETECTED  URINALYSIS, ROUTINE W REFLEX MICROSCOPIC     Status: Abnormal   Collection Time    01/16/13 10:01 PM      Result Value Range   Color, Urine YELLOW  YELLOW   APPearance HAZY (*) CLEAR   Specific Gravity, Urine <1.005 (*) 1.005 - 1.030   pH 6.0  5.0 - 8.0   Glucose, UA NEGATIVE  NEGATIVE mg/dL   Hgb urine dipstick TRACE (*) NEGATIVE   Bilirubin Urine NEGATIVE  NEGATIVE   Ketones, ur NEGATIVE  NEGATIVE mg/dL   Protein, ur NEGATIVE  NEGATIVE mg/dL   Urobilinogen, UA 0.2  0.0 - 1.0 mg/dL   Nitrite NEGATIVE  NEGATIVE   Leukocytes, UA NEGATIVE  NEGATIVE  PREGNANCY, URINE     Status: None   Collection  Time    01/16/13 10:01 PM      Result Value Range   Preg Test, Ur NEGATIVE  NEGATIVE  URINE MICROSCOPIC-ADD ON     Status: None   Collection Time    01/16/13 10:01 PM      Result Value Range   RBC / HPF 0-2  <3 RBC/hpf   12:05 AM Patient sleeping comfortably.  6:29 AM Patient now awake and alert, able to ambulate. She is amnestic for the events that led to her arrival here. She denies any attempt at self-harm. She admits that she has been noncompliant with her Antabuse. She admits that she probably mixed Xanax with alcohol and admits to smoking marijuana.   Carlisle Beers Leibish Mcgregor, MD 01/17/13 0630

## 2013-01-16 NOTE — ED Notes (Signed)
Per friend: pt "started shaking, I think it was a seizure. Then she was fine. She went to eat and had a few drinks, I don't know what she drank. Then she fell out." Friend reports she is supposed to take a pill to keep her from drinking.

## 2013-01-16 NOTE — ED Notes (Signed)
Dr Lynelle DoctorKnapp at bedside. Patient uncooperative, cursing, and attempting to leave facility. Verbal order for Geodon 10mg  IM, now.

## 2013-01-16 NOTE — ED Notes (Signed)
Pt states she took 5 Xanax. Pt very tearful, agitated.

## 2013-01-16 NOTE — ED Notes (Signed)
Patient continues to curse and Radiographer, therapeuticthreaten officer. Patient remains handcuffed to stretcher. Officer at bedside.

## 2013-01-16 NOTE — ED Notes (Signed)
Patient admitted to drinking alcohol "a lot," states she took 3-5 xanax, and "smoked pot" tonight.

## 2013-01-17 LAB — ETHANOL: ALCOHOL ETHYL (B): 198 mg/dL — AB (ref 0–11)

## 2013-01-17 MED ORDER — ONDANSETRON HCL 4 MG/2ML IJ SOLN: 4.0000 mg | Freq: Once | INTRAMUSCULAR | Status: DC

## 2013-01-17 MED ORDER — PANTOPRAZOLE SODIUM 40 MG IV SOLR
40.0000 mg | Freq: Once | INTRAVENOUS | Status: DC
Start: 2013-01-17 — End: 2013-01-17

## 2013-01-17 NOTE — ED Notes (Signed)
Patient alert, smiling, and ambulating with no assistance or difficulty. States "I don't remember anything. I don't remember how I got here or anything that happened." Patient states she has a ride coming to pick her up.

## 2013-01-17 NOTE — ED Notes (Addendum)
Advised MD that patient had pulled IV out previously. MD ordered to wait on IV medications. Patient lying in bed sleeping at this time. Equal rise and fall of chest noted. Patient remains on cardiac monitor.

## 2013-01-17 NOTE — Discharge Instructions (Signed)
Alcohol Intoxication Alcohol intoxication occurs when the amount of alcohol that a person has consumed impairs his or her ability to mentally and physically function. Alcohol directly impairs the normal chemical activity of the brain. Drinking large amounts of alcohol can lead to changes in mental function and behavior, and it can cause many physical effects that can be harmful.  Alcohol intoxication can range in severity from mild to very severe. Various factors can affect the level of intoxication that occurs, such as the person's age, gender, weight, frequency of alcohol consumption, and the presence of other medical conditions (such as diabetes, seizures, or heart conditions). Dangerous levels of alcohol intoxication may occur when people drink large amounts of alcohol in a short period (binge drinking). Alcohol can also be especially dangerous when combined with certain prescription medicines or "recreational" drugs. SIGNS AND SYMPTOMS Some common signs and symptoms of mild alcohol intoxication include:  Loss of coordination.  Changes in mood and behavior.  Impaired judgment.  Slurred speech. As alcohol intoxication progresses to more severe levels, other signs and symptoms will appear. These may include:  Vomiting.  Confusion and impaired memory.  Slowed breathing.  Seizures.  Loss of consciousness. DIAGNOSIS  Your health care provider will take a medical history and perform a physical exam. You will be asked about the amount and type of alcohol you have consumed. Blood tests will be done to measure the concentration of alcohol in your blood. In many places, your blood alcohol level must be lower than 80 mg/dL (0.08%) to legally drive. However, many dangerous effects of alcohol can occur at much lower levels.  TREATMENT  People with alcohol intoxication often do not require treatment. Most of the effects of alcohol intoxication are temporary, and they go away as the alcohol naturally  leaves the body. Your health care provider will monitor your condition until you are stable enough to go home. Fluids are sometimes given through an IV access tube to help prevent dehydration.  HOME CARE INSTRUCTIONS  Do not drive after drinking alcohol.  Stay hydrated. Drink enough water and fluids to keep your urine clear or pale yellow. Avoid caffeine.   Only take over-the-counter or prescription medicines as directed by your health care provider.  SEEK MEDICAL CARE IF:   You have persistent vomiting.   You do not feel better after a few days.  You have frequent alcohol intoxication. Your health care provider can help determine if you should see a substance use treatment counselor. SEEK IMMEDIATE MEDICAL CARE IF:   You become shaky or tremble when you try to stop drinking.   You shake uncontrollably (seizure).   You throw up (vomit) blood. This may be bright red or may look like black coffee grounds.   You have blood in your stool. This may be bright red or may appear as a black, tarry, bad smelling stool.   You become lightheaded or faint.  MAKE SURE YOU:   Understand these instructions.  Will watch your condition.  Will get help right away if you are not doing well or get worse. Document Released: 10/11/2004 Document Revised: 09/03/2012 Document Reviewed: 06/06/2012 [facility] Patient Information 2014 [place].   Emergency Department Resource Guide 1) Find a Doctor and Pay Out of Pocket Although you won't have to find out who is covered by your insurance plan, it is a good idea to ask around and get recommendations. You will then need to call the office and see if the doctor you have chosen  will accept you as a new patient and what types of options they offer for patients who are self-pay. Some doctors offer discounts or will set up payment plans for their patients who do not have insurance, but you will need to ask so you aren't surprised when you get to your  appointment.  2) Contact Your Local Health Department Not all health departments have doctors that can see patients for sick visits, but many do, so it is worth a call to see if yours does. If you don't know where your local health department is, you can check in your phone book. The CDC also has a tool to help you locate your state's health department, and many state websites also have listings of all of their local health departments.  3) Find a [facility] If your illness is not likely to be very severe or complicated, you may want to try a walk in clinic. These are popping up all over the country in pharmacies, drugstores, and shopping centers. They're usually staffed by nurse practitioners or physician assistants that have been trained to treat common illnesses and complaints. They're usually fairly quick and inexpensive. However, if you have serious medical issues or chronic medical problems, these are probably not your best option.  No Primary Care Doctor: - Call Health Connect at  [phone] - they can help you locate a primary care doctor that  accepts your insurance, provides certain services, etc. - Physician Referral Service- [phone]  Chronic Pain Problems: Organization         Address  Phone   Notes  [facility]  [phone] Patients need to be referred by their primary care doctor.   Medication Assistance: Organization         Address  Phone   Notes  [facility] Medication [facility] [address] [place], [place] [address] [phone] --Must be a resident of [facility] -- Must have NO insurance coverage whatsoever (no Medicaid/ Medicare, etc.) -- The pt. MUST have a primary care doctor that directs their care regularly and follows them in the community   MedAssist  [phone]   [employer]  [phone]    Agencies that provide inexpensive medical care: Organization         Address  Phone   Notes  [place]  [phone]   [facility]    [phone]   [facility] [place],  [address] [phone]   [place] [address], [place] [phone]   Planned Parenthood    [phone]   [facility]    [phone]   [place] and [place] [address], [place] Phone:  [phone], Fax:  [phone] Hours of Operation:  9 am - 6 pm, M-F.  Also accepts Medicaid/Medicare and self-pay.  [facility] for [place], [address], [place] Phone: [phone], Fax: [phone]. Hours of Operation:  8:30 am - 5:30 pm, M-F.  Also accepts Medicaid and self-pay.  [facility] [place] [address], [place] Phone: [phone]   [place], [place], [place] [phone], Ext. [phone] Mondays & Thursdays: 7-9 AM.  First 15 patients are seen on a first come, first serve basis.    [place] Providers:  Organization  Address  Phone   Notes  [facility] [address], [place] [phone] Also accepts self-pay patients.  [facility] [id number] [place], [place]  [phone]   [place], [address], [place] [phone]   [facility] [address], [place] [phone]   Lucianne Lei [address], [place]   [phone] Only accepts [place] Access [place] patients after they have their name applied to their card.   Self-Pay (no insurance) in [facility]:  Organization         Address  Phone   Notes  Sickle Cell Patients, [facility] [place] [phone]   [facility] [place] [phone]   [facility]  [place]  [place], [place], [place] [phone]   [facility]/Dr. Osei-Bonsu  [address] [place] or [address], [place] [phone] Phone number for both [place] and [place] locations is the same.  Urgent Medical and [facility] [address], [place] [phone]   [facility] [address], [place] or [address] [phone]   [facility] [address] [place] [phone], phone; [phone], fax Sees patients 1st and 3rd Saturday of every month.  Must not qualify for public or private insurance (i.e. Medicaid, Medicare, [place] Health Choice, Veterans' Benefits)  Household income should be no more than 200% of the poverty level The clinic cannot treat you if you are pregnant or think you are pregnant  Sexually transmitted diseases are not treated at the clinic.    Dental Care: Organization         Address  Phone  Notes  [facility] [place] [phone] Accepts children up to age 49 who are enrolled in [place] or [place]; pregnant women with a Medicaid card; and children who have applied for Medicaid or [place] Health Choice, but were declined, whose parents can pay a reduced fee at time of service.  [facility] Department of [facility]  [address], [place] [phone] Accepts children up to age 71 who are enrolled in [place] or [place]; pregnant women with a Medicaid card; and children who have applied for Medicaid or [place] Health Choice, but were declined, whose parents can pay a reduced fee at time of service.  [place] [facility]  [place] [phone] Patients are seen by appointment only. Walk-ins are not accepted. [place] will see patients 31 years of age and  older. Monday - Tuesday (8am-5pm) Most Wednesdays (8:30-5pm) $30 per visit, cash only  [facility]  [address], [facility] [phone] Patients are seen by appointment only. Walk-ins are not accepted. [place] will see patients 8 years of age and older. One Wednesday Evening (Monthly: Volunteer Based).  $30 per visit, cash only  [place]  [phone] for adults; Children under age 39, call Graduate Pediatric Dentistry at [phone]. Children aged 82-14, please call [phone] to request a pediatric application.  Dental services are provided in all areas of dental care including  fillings, crowns and bridges, complete and partial dentures, implants, gum treatment, root canals, and extractions. Preventive care is also provided. Treatment is provided to both adults and children. Patients are selected via a lottery and there is often a waiting list.   [facility] [address] [place]  [phone] [email or web address]   [facility] [address] [place], [place] [phone], Ext. [phone] Second and Fourth Thursday of each month, opens at 6:30 AM; Clinic ends at 9 AM.  Patients are seen on a first-come first-served basis, and a limited number are seen during each clinic.   [facility]  [address] [place] [place], [place] [phone]   Eligibility Requirements You must have lived in [place], [place], or [place] counties for at least the last three months.   You cannot be eligible for state or federal sponsored [employer], including [employer], [place], or [employer].   You generally cannot be eligible for healthcare insurance through your employer.    How to apply: Eligibility screenings are held every Tuesday and Wednesday afternoon from 1:00 pm until 4:00 pm. You do not need an appointment for the interview!  [facility] [address]  [place], [place]   [place]  [place] Department  [place]  [phone]    Behavioral Health Resources in the Community: Intensive Outpatient Programs Organization         Address  Phone  Notes  [place]. [address] [place], [place] [phone]   [facility] [address] [place], [place]   ADS: [facility] [address] [place], [place]   [place] [address]  [place], [place] or [phone]   Substance Abuse Resources Organization         Address  Phone  Notes  Alcohol and Drug Services  [phone]   [place]  [phone]   [facility] [place]   Chinita Pester  [phone]   Residential & Outpatient Substance Abuse Program  [phone]   Psychological Services Organization         Address  Phone  Notes  [facility] [place]  [place]  [phone]   [place] [address] [place] or [phone]    Mobile Crisis Teams Organization         Address  Phone  Notes  [facility], Mobile Crisis Care Unit  [phone]   [facility]  [address] [place], [place]   [place] [address] [place] [phone]    Self-Help/Support Groups Organization         Address  Phone             Notes  [place]. of [place] - variety of support groups  [facility] [place] Call for more information  Narcotics Anonymous (NA), [facility] [address], [employer] [place]  2 meetings at this location   Special educational needs teacher         Address  Phone  Notes  [facility] [place],    [place]  [place]  [address], [place] [phone], [place], [place]   [place],  [place] [phone] Admissions: 8am-3pm M-F  °[facility] [address]    °[place], [place] [phone]   °[facility] [address], [place], [place] [phone]   °[facility] [address]  °[place], [place] [phone]   °[facility] - Intensive Outpatient [address], [place], [place] [phone]   °[facility] [address]  °[place], [place] [phone] or [phone]   °[facility] ([facility]) [address] [place], [place] [phone] Accepts Medicaid  °[facility] [address]  °[place] [phone] Substance Abuse/Addiction Treatment  ° °[place] Behavioral Health Resources °Organization         Address  Phone  Notes  °[facility]  ([phone]) [phone]   °Julie Brannon, PhD [address] [place], [place]   ([phone]) [phone] or (336) [phone]   °[facility]   [address] °[place], [place] ([phone]) [phone]   °[facility] [address], [place], [place] ([phone]) [phone] Insurance/Medicaid/sponsorship through [facility]  °[facility] [address]                                    [place], [place] [phone] Therapy/tele-psych/case  °[facility] [address]  ° [place], [place] ([phone]) [phone]    °Dr. Arfeen  ([phone]   °[facility] [place]  [employer] [place] [facility] 1) [address], [place] °2) [address], [place] °3)  [address], [place] ([phone]) [phone] °([phone]) [phone] ° °([phone]) [phone]   °[place] Child Abuse Hotline ([phone]) [phone] or ([phone]) [phone] (After Hours)    ° ° ° °

## 2013-03-16 ENCOUNTER — Emergency Department (HOSPITAL_COMMUNITY)
Admission: EM | Admit: 2013-03-16 | Discharge: 2013-03-16 | Disposition: A | Discharge status: Home / Self Care | Payer: Self-pay | Attending: Emergency Medicine | Admitting: Emergency Medicine

## 2013-03-16 ENCOUNTER — Encounter (HOSPITAL_COMMUNITY): Payer: Self-pay | Admitting: Emergency Medicine

## 2013-03-16 DIAGNOSIS — F17200 Nicotine dependence, unspecified, uncomplicated: Secondary | ICD-10-CM | POA: Insufficient documentation

## 2013-03-16 DIAGNOSIS — F4310 Post-traumatic stress disorder, unspecified: Secondary | ICD-10-CM | POA: Insufficient documentation

## 2013-03-16 DIAGNOSIS — F19239 Other psychoactive substance dependence with withdrawal, unspecified: Secondary | ICD-10-CM

## 2013-03-16 DIAGNOSIS — F1310 Sedative, hypnotic or anxiolytic abuse, uncomplicated: Secondary | ICD-10-CM | POA: Insufficient documentation

## 2013-03-16 DIAGNOSIS — Z79899 Other long term (current) drug therapy: Secondary | ICD-10-CM | POA: Insufficient documentation

## 2013-03-16 DIAGNOSIS — F329 Major depressive disorder, single episode, unspecified: Secondary | ICD-10-CM | POA: Insufficient documentation

## 2013-03-16 DIAGNOSIS — J45909 Unspecified asthma, uncomplicated: Secondary | ICD-10-CM | POA: Insufficient documentation

## 2013-03-16 DIAGNOSIS — R569 Unspecified convulsions: Secondary | ICD-10-CM | POA: Insufficient documentation

## 2013-03-16 DIAGNOSIS — Z8742 Personal history of other diseases of the female genital tract: Secondary | ICD-10-CM | POA: Insufficient documentation

## 2013-03-16 DIAGNOSIS — R51 Headache: Secondary | ICD-10-CM | POA: Insufficient documentation

## 2013-03-16 DIAGNOSIS — F3289 Other specified depressive episodes: Secondary | ICD-10-CM | POA: Insufficient documentation

## 2013-03-16 DIAGNOSIS — F19939 Other psychoactive substance use, unspecified with withdrawal, unspecified: Secondary | ICD-10-CM | POA: Insufficient documentation

## 2013-03-16 MED ORDER — ALPRAZOLAM 0.5 MG PO TABS
1.0000 mg | ORAL_TABLET | Freq: Once | ORAL | Status: AC
  Administered 2013-03-16: 1 mg via ORAL
  Filled 2013-03-16: qty 2

## 2013-03-16 MED ORDER — ALPRAZOLAM 1 MG PO TABS: 1.0000 mg | ORAL_TABLET | Freq: Three times a day (TID) | ORAL | Status: DC

## 2013-03-16 NOTE — Discharge Instructions (Signed)
Benzodiazepine Withdrawal  °Benzodiazepines are a group of drugs that are prescribed for both short-term and long-term treatment of a variety of medical conditions. For some of these conditions, such as seizures and sudden and severe muscle spasms, they are used only for a few hours or a few days. For other conditions, such as anxiety, sleep problems, or frequent muscle spasms or to help prevent seizures, they are used for an extended period, usually weeks or months. °Benzodiazepines work by changing the way your brain functions. Normally, chemicals in your brain called neurotransmitters send messages between your brain cells. The neurotransmitter that benzodiazepines affect is called gamma-aminobutyric acid (GABA). GABA sends out messages that have a calming effect on many of the functions of your brain. Benzodiazepines make these messages stronger and increase this calming effect. °Short-term use of benzodiazepines usually does not cause problems when you stop taking the drugs. However, if you take benzodiazepines for a long time, your body can adjust to the drug and require more of it to produce the same effect (drug tolerance). Eventually, you can develop physical dependence on benzodiazepines, which is when you experience negative effects if your dosage of benzodiazepines is reduced or stopped too quickly. These negative effects are called symptoms of withdrawal. °SYMPTOMS °Symptoms of withdrawal may begin anytime within the first 10 days after you stop taking the benzodiazepine. They can last from several weeks up to a few months but usually are the worst between the first 10 to 14 days.  °The actual symptoms also vary, depending on the type of benzodiazepine you take. Possible symptoms include: °· Anxiety. °· Excitability. °· Irritability. °· Depression. °· Mood swings. °· Trouble sleeping. °· Confusion. °· Uncontrollable shaking (tremors). °· Muscle weakness. °· Seizures. °DIAGNOSIS °To diagnose  benzodiazepine withdrawal, your caregiver will examine you for certain signs, such as: °· Rapid heartbeat. °· Rapid breathing. °· Tremors. °· High blood pressure. °· Fever. °· Mood changes. °Your caregiver also may ask the following questions about your use of benzodiazepines: °· What type of benzodiazepine did you take? °· How much did you take each day? °· How long did you take the drug? °· When was the last time you took the drug? °· Do you take any other drugs? °· Have you had alcohol recently? °· Have you had a seizure recently? °· Have you lost consciousness recently? °· Have you had trouble remembering recent events? °· Have you had a recent increase in anxiety, irritability, or trouble sleeping? °A drug test also may be administered. °TREATMENT °The treatment for benzodiazepine withdrawal can vary, depending on the type and severity of your symptoms, what type of benzodiazepine you have been taking, and how long you have been taking the benzodiazepine. Sometimes it is necessary for you to be treated in a hospital, especially if you are at risk of seizures.  °Often, treatment includes a prescription for a long-acting benzodiazepine, the dosage of which is reduced slowly over a long period. This period could be several weeks or months. Eventually, your dosage will be reduced to a point that you can stop taking the drug, without experiencing withdrawal symptoms. This is called tapered withdrawal. Occasionally, minor symptoms of withdrawal continue for a few days or weeks after you have completed a tapered withdrawal. °SEEK IMMEDIATE MEDICAL CARE IF: °· You have a seizure. °· You develop a craving for drugs or alcohol. °· You begin to experience symptoms of withdrawal during your tapered withdrawal. °· You become very confused. °· You lose consciousness. °· You   have trouble breathing. °· You think about hurting yourself or someone else. °Document Released: 12/21/2010 Document Revised: 03/26/2011 Document  Reviewed: 12/21/2010 °ExitCare® Patient Information ©2014 [employer]. ° °

## 2013-03-16 NOTE — ED Provider Notes (Signed)
CSN: [id number]     Arrival date & time 03/16/13  2129 History   First MD Initiated Contact with Patient 03/16/13 2156   This chart was scribed for Hurman Horn, MD by Valera Castle, ED Scribe. This patient was seen in room APA11/APA11 and the patient's care was started at 10:00 PM.   Chief Complaint  Patient presents with  . Seizures   (Consider location/radiation/quality/duration/timing/severity/associated sxs/prior Treatment) The history is provided by the patient and a relative. No language interpreter was used.   HPI Comments: Glenda [name] is a 33 y.o. 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 Pt complains of current headache which is typical for her after a benzodiazepine withdrawal seizure. She denies remembering the seizure. She did not hit her head, suffer any wounds.  She was lying in bed when the seizure occurred. She has a history of benzodiazepine withdrawal seizures.  She has been taking her Xanax and Antabuse regularly, but states last night she did not take her Xanax. She took Antabuse and Benadryl last night. She states she only has 2 pills of Xanax left. She was prescribed for 3 mg of Xanax, but usually only takes 2 mg a day.   She denies recent EtOH use. She denies HI, SI, chest pain, fever, chills, vomiting, hallucinations, numbness, weakness, SOB, back pain, neck pain, and any other associated symptoms. She denies being pregnant, h/o hysterectomy, tubal ligation. No change in speech, vision, swallow, or understanding and no new incoordination.  Has appointment 03/26/13 with [facility]. New visit.  PCP - MANN, BENJAMIN, PA-C - last MD to see her, over 1 month ago.   Past Medical History  Diagnosis Date  . Bronchitis   . Asthma   .  Depression   . PTSD (post-traumatic stress disorder)   . 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 Polysubstance abuse   . Bacterial vaginosis   . Seizures    Past Surgical History  Procedure Laterality Date  . Cyst removed      from forehead   History reviewed. No pertinent family history. History  Substance Use Topics  . Smoking status: Current Every Day Smoker -- 1.00 packs/day    Types: Cigarettes  . Smokeless tobacco: Not on file  . Alcohol Use: No     Comment: social   OB History   Grav Para Term Preterm Abortions TAB SAB Ect Mult Living                 Review of Systems 10 Systems reviewed and all are negative for acute change except as noted in the HPI.  Allergies  Prednisone  Home Medications   Current Outpatient Rx  Name  Route  Sig  Dispense  Refill  . ALPRAZolam (XANAX) 1 MG tablet   Oral   Take 1 mg by mouth 3 (three) times daily.         . citalopram (CELEXA) 40 MG tablet   Oral   Take 40 mg by mouth every evening.          . diphenhydrAMINE (BENADRYL) 25 mg capsule   Oral   Take 25 mg by mouth at bedtime as needed.         Marland Kitchen  disulfiram (ANTABUSE) 250 MG tablet   Oral   Take 250 mg by mouth every evening.          Marland Kitchen. ibuprofen (ADVIL,MOTRIN) 200 MG tablet   Oral   Take 600 mg by mouth every 6 (six) hours as needed. For pain         . ALPRAZolam (XANAX) 1 MG tablet   Oral   Take 1 tablet (1 mg total) by mouth 3 (three) times daily.   10 tablet   0    BP 130/84  Pulse 60  Temp(Src) 98.4 F (36.9 C) (Oral)  Resp 16  Ht 5\' 6"  (1.676 m)  Wt 135 lb (61.236 kg)  BMI 21.80 kg/m2  SpO2 96%  LMP 03/05/2013  Physical Exam  Nursing note and vitals reviewed. Constitutional:  Awake, alert, nontoxic appearance with baseline speech for patient.  HENT:  Head: Atraumatic.  Mouth/Throat: No oropharyngeal exudate.  Eyes: EOM are normal. Pupils are equal, round, and reactive to light. Right eye  exhibits no discharge. Left eye exhibits no discharge.  Neck: Neck supple.  Cardiovascular: Normal rate, regular rhythm and normal heart sounds.   No murmur heard. Pulmonary/Chest: Effort normal and breath sounds normal. No stridor. No respiratory distress. She has no wheezes. She has no rales. She exhibits no tenderness.  Abdominal: Soft. Bowel sounds are normal. She exhibits no mass. There is no tenderness. There is no rebound.  Musculoskeletal: Normal range of motion. She exhibits no edema and no tenderness.  Baseline ROM, moves extremities with no obvious new focal weakness.  Lymphadenopathy:    She has no cervical adenopathy.  Neurological: She is alert.  Awake, alert, cooperative and aware of situation; motor strength 5/5 bilaterally; sensation normal to light touch bilaterally; peripheral visual fields full to confrontation; no facial asymmetry; tongue midline; major cranial nerves appear intact; no pronator drift, normal finger to nose bilaterally, baseline gait without new ataxia. No nystagmus. Negative test of skew.   Skin: No rash noted.  Psychiatric: She has a normal mood and affect.    ED Course  Procedures (including critical care time)  DIAGNOSTIC STUDIES: Oxygen Saturation is 96% on room air, normal by my interpretation.    COORDINATION OF CARE: 10:12 PM- Patient / Family / Caregiver informed of clinical course, understand medical decision-making process, and agree with plan.   Medications  ALPRAZolam Prudy Feeler(XANAX) tablet 1 mg (1 mg Oral Given 03/16/13 2234)    MDM   Final diagnoses:  Drug withdrawal seizure  I doubt any other EMC precluding discharge at this time including, but not necessarily limited to the following: Severe withdrawal.   I personally performed the services described in this documentation, which was scribed in my presence. The recorded information has been reviewed and is accurate.  Hurman HornJohn M Wadie Liew, MD 03/19/13 2033

## 2013-03-16 NOTE — ED Notes (Signed)
Per [facility] - pt reported to have had x1 episode of a grand mal seizure at home witnessed by family - pt w/ hx of seizures and is currently prescribed alprazolam, citalopram and antibuse. Pt currently c/o HA at present.

## 2013-03-16 NOTE — ED Notes (Signed)
Pt ambulating independently w/ steady gait on d/c in no acute distress, A&Ox4. D/c instructions reviewed w/ pt and family - pt and family deny any further questions or concerns at present. Rx given x1  

## 2013-03-26 ENCOUNTER — Encounter: Payer: Self-pay | Admitting: Family Medicine

## 2013-03-26 ENCOUNTER — Ambulatory Visit: Payer: Self-pay | Admitting: Family Medicine

## 2013-03-26 VITALS — BP 110/75 | HR 85 | Temp 97.4°F | Resp 16 | Ht 65.5 in | Wt 125.0 lb

## 2013-03-26 DIAGNOSIS — F1911 Other psychoactive substance abuse, in remission: Secondary | ICD-10-CM | POA: Insufficient documentation

## 2013-03-26 DIAGNOSIS — F411 Generalized anxiety disorder: Secondary | ICD-10-CM

## 2013-03-26 DIAGNOSIS — F1910 Other psychoactive substance abuse, uncomplicated: Secondary | ICD-10-CM

## 2013-03-26 DIAGNOSIS — F1021 Alcohol dependence, in remission: Secondary | ICD-10-CM

## 2013-03-26 DIAGNOSIS — F419 Anxiety disorder, unspecified: Secondary | ICD-10-CM | POA: Insufficient documentation

## 2013-03-26 MED ORDER — DISULFIRAM 250 MG PO TABS: 250.0000 mg | ORAL_TABLET | Freq: Every evening | ORAL | Status: DC

## 2013-03-26 MED ORDER — ALPRAZOLAM 1 MG PO TABS: 1.0000 mg | ORAL_TABLET | Freq: Three times a day (TID) | ORAL | Status: DC

## 2013-03-26 MED ORDER — CITALOPRAM HYDROBROMIDE 40 MG PO TABS: 40.0000 mg | ORAL_TABLET | Freq: Every evening | ORAL | Status: DC

## 2013-03-26 NOTE — Patient Instructions (Signed)
Alprazolam 1 mg tablets - I have prescribed #60 tablets which needs to last for 20 days. It would be great if you could work on reducing the dose to 1/2 tablet in the morning and 1/2 tablet at midday and continue taking 1 tablet at bedtime to help you sleep. This will be my goal as I work with you over the next months. You will have to call the office when your next refill is due and I will cal it to the pharmacy. As I mentioned, it is important that you use the same pharmacy for consistency and it helps our efforts to keep track of your medications.

## 2013-03-26 NOTE — Progress Notes (Deleted)
In 2014, patient was [facility] at [facility] in [place] by Dr Geanie CooleyLay. Also, in 2012 she was seen by Dr Gerda DissLuking and was prescribed Xanax and Celexa. She saw Dwyane LuoBen Mann, PA-C in Jan 2015 at [facility] in [place] at that time she was prescribed Antabuse.

## 2013-03-26 NOTE — Progress Notes (Signed)
S:  This 33 y.o. Cauc female is here to establish care and receive management and follow-up for treatment of GAD, insomnia and to maintain sobriety w/ use of Antabuse. She has been in 2 treatment programs in last 3 years; Antabuse has been effective for last 7 months. Pt started consuming alcohol at an early age but abuse and dependence started at age 62. She attends AA meetings every week and has a sponsor named Misty Stanley. 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 Tobacco use is current and chronic as well.  She was treated for benzodiazepine withdrawal in [facility] ED on 03/16/2013. When advised that current Alprazolam dose is moderately high and that pt is probably  dependent on it, she stated that it was "pretty much the only medication that will control my anxiety).   Patient Active Problem List   Diagnosis Date Noted  . History of substance abuse 03/26/2013  . Personal history of alcoholism 03/26/2013  . Anxiety disorder 03/26/2013    Prior to Admission medications   Medication Sig Start Date End Date Taking? Authorizing Provider  ALPRAZolam Prudy Feeler) 1 MG tablet Take 1 tablet (1 mg total) by mouth 3 (three) times daily.   Yes   citalopram (CELEXA) 40 MG tablet Take 1 tablet (40 mg total) by mouth every evening.   Yes   diphenhydrAMINE (BENADRYL) 25 mg capsule Take 25 mg by mouth at bedtime as needed.   Yes Historical Provider, MD  disulfiram (ANTABUSE) 250 MG tablet Take 1 tablet (250 mg total) by mouth every evening.   Yes   ibuprofen (ADVIL,MOTRIN) 200 MG tablet Take 600 mg by mouth every 6 (six) hours as needed. For pain   Yes Historical Provider, MD   PMHx, Surg Hx, Soc and Fam Hx reviewed. 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 Significant family hx of mental illness.  ROS: As per HPI; negative for anorexia, fatigue, diaphoresis, abnormal weight change, vision disturbances, problems w/ oral cavity or dentition, CP  or tightness, palpitations, SOB or DOE, cough, n/v, abd or pelvic pain, change in stools, skin changes/ jaundice, HA, dizziness, weakness, numbness, tremor or syncope. She has no agitation or confusion and behavior is reported as normal. Mild sleep disturbance present for years. No ideas of self harm.  O: Filed Vitals:   03/26/13 1348  BP: 110/75  Pulse: 85  Temp: 97.4 F (36.3 C)  Resp: 16   GEN: in NAD: WN,WD. HENT: Wann/AT; EOMI w/ clear conj/sclerae. EACs and nose normal. Oroph w/o lesions and otherwise unremarkable. NECK: Supple w/o LAN or TMG. COR: RRR. Normal S1 and S2. No m/g/r. LUNGS: CTA: No wheezes or rhonchi. Unlabored resp. BACK: No CVAT; spine straight w/o deformity or muscle tenderness. ABD: Flat w/ normal appearance. Normal BS. No guarding. No HSM or masses. SKIN: W&D; intact w/o diaphoresis, jaundice, rashes or pallor. NEURO: A&O x 3; CNs intact. DTRs- 2+/=. Gait is normal.  PSYCH: Pleasant demeanor but slightly fidgety. Attentive w/ good eye contact. Speech pattern and thought content appropriate. Judgement is sound and memory is intact.  A/P: Anxiety disorder- Continue Alprazolam; advised pt that dose reduction will be addressed in future. Encouraged her to try reducing dose to 1/2 tab in morning and at midday depending on how she feels and how much stress she encounters during her daily routine. Continue Citalopram 40 mg daily.  History of substance abuse  Personal history of alcoholism- Continue Antabuse, Aa meetings and  staying in contact w/ sponsor.  Meds ordered this encounter  Medications  . citalopram (CELEXA) 40 MG tablet    Sig: Take 1 tablet (40 mg total) by mouth every evening.    Dispense:  30 tablet    Refill:  3  . disulfiram (ANTABUSE) 250 MG tablet    Sig: Take 1 tablet (250 mg total) by mouth every evening.    Dispense:  30 tablet    Refill:  3  . ALPRAZolam (XANAX) 1 MG tablet    Sig: Take 1 tablet (1 mg total) by mouth 3 (three) times daily.     Dispense:  60 tablet    Refill:  0

## 2013-03-29 ENCOUNTER — Encounter: Payer: Self-pay | Admitting: Family Medicine

## 2013-04-19 ENCOUNTER — Other Ambulatory Visit: Payer: Self-pay | Admitting: Family Medicine

## 2013-04-20 NOTE — Telephone Encounter (Signed)
Alprazolam refill (#60 w/ no refills) phoned to pt's pharmacy.

## 2013-04-22 ENCOUNTER — Other Ambulatory Visit: Payer: Self-pay

## 2013-04-22 NOTE — Telephone Encounter (Signed)
Pharm reqs Rf of xanax 1 mg. Pended for review.

## 2013-04-22 NOTE — Telephone Encounter (Signed)
Alprazolam refill phoned to pharmacy on 04/20/2013.

## 2013-05-12 ENCOUNTER — Other Ambulatory Visit: Payer: Self-pay | Admitting: Family Medicine

## 2013-05-13 ENCOUNTER — Other Ambulatory Visit: Payer: Self-pay | Admitting: Family Medicine

## 2013-05-13 NOTE — Telephone Encounter (Signed)
Dr Audria NineMcPherson, I left quantity as sent, but wasn't sure whether you intended pt to have #90 for the month, or if you wanted her to only use the #60 in a month. It looks like she ran out a little before month was out.

## 2013-05-13 NOTE — Telephone Encounter (Signed)
Alprazolam refill phoned to pharmacy; pt has been instructed in reduced use of this medication such that 60 tabs must last 1 month.

## 2013-06-03 ENCOUNTER — Other Ambulatory Visit: Payer: Self-pay | Admitting: Family Medicine

## 2013-06-04 ENCOUNTER — Telehealth: Payer: Self-pay | Admitting: Radiology

## 2013-06-04 NOTE — Telephone Encounter (Signed)
Glenda [name]: spoke with Dr Audria NineMcPherson, she may authorize a prescription to last patient until her next appointment which is 06/15/13.

## 2013-06-04 NOTE — Telephone Encounter (Signed)
Noted thanks °

## 2013-06-04 NOTE — Telephone Encounter (Signed)
Alprazolam refill phoned to pt's pharmacy. Call pt and let her know and I recommend she follow directions as prescribed.

## 2013-06-18 ENCOUNTER — Ambulatory Visit: Payer: Self-pay | Admitting: Family Medicine

## 2013-06-28 ENCOUNTER — Other Ambulatory Visit: Payer: Self-pay | Admitting: Family Medicine

## 2013-06-30 NOTE — Telephone Encounter (Signed)
Alprazolam refill phoned to pt's pharmacy but cannot be filled until July 04, 2013.

## 2013-07-02 ENCOUNTER — Encounter: Payer: Self-pay | Admitting: Family Medicine

## 2013-08-04 ENCOUNTER — Other Ambulatory Visit: Payer: Self-pay | Admitting: Family Medicine

## 2013-08-05 NOTE — Telephone Encounter (Signed)
I phoned Alprazolam to pt's pharmacy; however, it is only enough for 2 weeks. Pt must schedule OV within next 2 weeks. I left that message w/ the pharmacy.  I have some openings on August 14, 2013. Please advise her that she needs OV for additional refills. Thank you.

## 2013-09-23 ENCOUNTER — Telehealth: Payer: Self-pay

## 2013-09-23 NOTE — Telephone Encounter (Signed)
Pharm sent req for RF of alprazolam. Dr Audria Nine, I see that you put note on last RF that pt needs OV for more. Pended for review.

## 2013-09-24 NOTE — Telephone Encounter (Signed)
Pt seen by me in March 2015 and that was the only visit she has had with me. I will not authorize more refills on this medication w/o an office visit.

## 2013-09-24 NOTE — Telephone Encounter (Signed)
LM to advise pt needs to RTC 

## 2013-09-29 ENCOUNTER — Other Ambulatory Visit: Payer: Self-pay

## 2013-09-29 NOTE — Telephone Encounter (Signed)
Pharm requests RF of alprazolam. Note on last RF from Dr Audria Nine stated pt needs OV for more. Contacted pt who advised she will try to get an appt, but she needs to find out how much it will cost d/t no job and no ins. She stated that she gets seizures from withdrawal when she doesn't take her two alprazolam tablets Qhs, and she "unfortunately has to get her xanax from somewhere if she can't get it from the drug store to prevent the seizures". She stated if she can afford it she would rather come in and get legitimate Rx. Transferred pt to Scheduling for info. Dr Audria Nine, [place] Childes.

## 2013-09-30 ENCOUNTER — Telehealth: Payer: Self-pay

## 2013-09-30 ENCOUNTER — Ambulatory Visit: Payer: Self-pay | Admitting: Family Medicine

## 2013-09-30 MED ORDER — ALPRAZOLAM 1 MG PO TABS: ORAL_TABLET | ORAL | Status: DC

## 2013-09-30 NOTE — Telephone Encounter (Signed)
Alprazolam 1 mg tablet refill (#30 w/ no refills) phoned to pharmacy. Interestingly, pt had Citalopram prescribed in March #30 w/ 3 RFs; she should be requesting refills for this medication but has not. This is the last refill I will authorize w/o office visit.

## 2013-09-30 NOTE — Telephone Encounter (Signed)
Dr. Audria Nine:  Patient called to cancel her appt. Today and rescheduled for 9/24 but wants to know if you will go ahead and refill her Alprazolam 1 mg

## 2013-10-01 ENCOUNTER — Other Ambulatory Visit: Payer: Self-pay | Admitting: *Deleted

## 2013-10-01 NOTE — Telephone Encounter (Signed)
Received fax from pharmacy about refill again.  Notified them that we are unable to refill until pt contacts office for an OV. Noting telephone message below.

## 2013-10-02 NOTE — Telephone Encounter (Signed)
I phoned Alprazolam refill to [facility] on 09/30/2013. That is the last refill I will do for this pt until her OV. If she does not keep her appt on 10/08/2013, I will not authorize any future medication refills.

## 2013-10-02 NOTE — Telephone Encounter (Signed)
Lm for pt to RTC- Rx at pharmacy.

## 2013-10-13 ENCOUNTER — Ambulatory Visit: Payer: Self-pay | Admitting: Family Medicine

## 2013-10-22 ENCOUNTER — Ambulatory Visit: Payer: Self-pay | Admitting: Family Medicine

## 2014-01-22 ENCOUNTER — Ambulatory Visit: Payer: Self-pay | Admitting: Family Medicine

## 2014-01-27 ENCOUNTER — Encounter (HOSPITAL_COMMUNITY): Payer: Self-pay | Admitting: *Deleted

## 2014-01-27 ENCOUNTER — Emergency Department (HOSPITAL_COMMUNITY)
Admission: EM | Admit: 2014-01-27 | Discharge: 2014-01-27 | Discharge status: Against Medical Advice | Payer: Self-pay | Attending: Emergency Medicine | Admitting: Emergency Medicine

## 2014-01-27 DIAGNOSIS — F10121 Alcohol abuse with intoxication delirium: Secondary | ICD-10-CM | POA: Insufficient documentation

## 2014-01-27 DIAGNOSIS — Z8669 Personal history of other diseases of the nervous system and sense organs: Secondary | ICD-10-CM | POA: Insufficient documentation

## 2014-01-27 DIAGNOSIS — F10921 Alcohol use, unspecified with intoxication delirium: Secondary | ICD-10-CM

## 2014-01-27 DIAGNOSIS — F329 Major depressive disorder, single episode, unspecified: Secondary | ICD-10-CM | POA: Insufficient documentation

## 2014-01-27 DIAGNOSIS — Z79899 Other long term (current) drug therapy: Secondary | ICD-10-CM | POA: Insufficient documentation

## 2014-01-27 DIAGNOSIS — Z8742 Personal history of other diseases of the female genital tract: Secondary | ICD-10-CM | POA: Insufficient documentation

## 2014-01-27 DIAGNOSIS — J45909 Unspecified asthma, uncomplicated: Secondary | ICD-10-CM | POA: Insufficient documentation

## 2014-01-27 DIAGNOSIS — R55 Syncope and collapse: Secondary | ICD-10-CM | POA: Insufficient documentation

## 2014-01-27 MED ORDER — PANTOPRAZOLE SODIUM 40 MG IV SOLR
40.0000 mg | Freq: Once | INTRAVENOUS | Status: AC
  Administered 2014-01-27: 40 mg via INTRAVENOUS

## 2014-01-27 MED ORDER — ONDANSETRON HCL 4 MG/2ML IJ SOLN: 4.0000 mg | Freq: Once | INTRAMUSCULAR | Status: DC

## 2014-01-27 MED ORDER — ONDANSETRON HCL 4 MG/2ML IJ SOLN
4.0000 mg | Freq: Once | INTRAMUSCULAR | Status: AC
  Administered 2014-01-27: 4 mg via INTRAVENOUS

## 2014-01-27 MED ORDER — LORAZEPAM 2 MG/ML IJ SOLN
2.0000 mg | Freq: Once | INTRAMUSCULAR | Status: AC
  Administered 2014-01-27: 2 mg via INTRAVENOUS

## 2014-01-27 MED ORDER — SODIUM CHLORIDE 0.9 % IV BOLUS (SEPSIS)
1000.0000 mL | Freq: Once | INTRAVENOUS | Status: AC
  Administered 2014-01-27: 1000 mL via INTRAVENOUS

## 2014-01-27 NOTE — ED Notes (Signed)
Pt ambulated to restroom to get urine sample, when pt left restroom no cup. Pt could stay where it went.

## 2014-01-27 NOTE — ED Notes (Signed)
Talked w/ pt at length about seeking help for addictions, pt tearful at times, wanting to leave. Able to talk pt into staying & be checked out,

## 2014-01-27 NOTE — ED Provider Notes (Signed)
See down time documentation.  Glenda SeamenJohn L Atif Chapple, MD 01/27/14 [phone]

## 2014-01-27 NOTE — ED Notes (Signed)
Pt walked out of ED, per security ride was here.

## 2014-01-27 NOTE — ED Notes (Signed)
Pt signed out AMA, to wait in room waiting for ride to arrive.

## 2014-01-27 NOTE — ED Notes (Signed)
Per EMS pt began seizing at friends house after she had been drinking shots. Foy GuadalajaraFried reports she has been consistently seizing since about 1230 this morning. Ems unable to get iv d/t pt being combative.

## 2014-03-19 ENCOUNTER — Encounter: Payer: Self-pay | Admitting: Family Medicine

## 2014-03-19 ENCOUNTER — Ambulatory Visit (INDEPENDENT_AMBULATORY_CARE_PROVIDER_SITE_OTHER): Payer: Self-pay | Admitting: Family Medicine

## 2014-03-19 VITALS — BP 119/86 | HR 60 | Temp 97.6°F | Resp 16 | Ht 66.0 in | Wt 128.0 lb

## 2014-03-19 DIAGNOSIS — R51 Headache: Secondary | ICD-10-CM

## 2014-03-19 DIAGNOSIS — Z87898 Personal history of other specified conditions: Secondary | ICD-10-CM

## 2014-03-19 DIAGNOSIS — F411 Generalized anxiety disorder: Secondary | ICD-10-CM

## 2014-03-19 DIAGNOSIS — Z6379 Other stressful life events affecting family and household: Secondary | ICD-10-CM

## 2014-03-19 DIAGNOSIS — F1911 Other psychoactive substance abuse, in remission: Secondary | ICD-10-CM

## 2014-03-19 DIAGNOSIS — R519 Headache, unspecified: Secondary | ICD-10-CM

## 2014-03-19 MED ORDER — ALPRAZOLAM 1 MG PO TABS: ORAL_TABLET | ORAL | Status: DC

## 2014-03-19 MED ORDER — CITALOPRAM HYDROBROMIDE 40 MG PO TABS: 40.0000 mg | ORAL_TABLET | Freq: Every evening | ORAL | Status: DC

## 2014-03-19 MED ORDER — DISULFIRAM 250 MG PO TABS: 250.0000 mg | ORAL_TABLET | Freq: Every evening | ORAL | Status: DC

## 2014-03-19 NOTE — Patient Instructions (Signed)
I want you to follow-up in 2 months but I understand your financial and social situation with your grandfather. Please take the Alprazolam as prescribed or do not use more than 2 tablets per day. I will not do early refills.   [facility] Policy for Prescribing Controlled Substances (Revised 11/2011) 1. Prescriptions for controlled substances will be filled by ONE provider at [facility] with whom you have established and developed a plan for your care, including follow-up. 2. You are encouraged to schedule an appointment with your prescriber at our appointment center for follow-up visits whenever possible. 3. If you request a prescription for the controlled substance while at [facility] for an acute problem (with someone other than your regular prescriber), you MAY be given a ONE-TIME prescription for a 30-day supply of the controlled substance, to allow time for you to return to see your regular prescriber for additional prescriptions.   If I check the state website for controlled substances and find that you have seen other medical care providers to get Alprazolam (Xanax), I can discontinue prescribing medications for you at my discretion.

## 2014-03-22 ENCOUNTER — Encounter: Payer: Self-pay | Admitting: Family Medicine

## 2014-03-22 NOTE — Progress Notes (Signed)
S:  This 34 y.o. Female has chronic anxiety disorder w/ hx of substance abuse. She states she remains "clean" but does obtain alprazolam from other persons when she runs out and cannot get to clinic for follow-up visits. At this time, she is living w/ her grandfather who has declining health. She is up and down w/ him through the night due to his cognitive impairment.  She has sinus HA today but otherwise feels good. Her financial situation is challenging. She is trying to work while taking care of her grandfather; other family members occasionally give her a break.  Patient Active Problem List   Diagnosis Date Noted  . History of substance abuse 03/26/2013  . Personal history of alcoholism 03/26/2013  . Anxiety disorder 03/26/2013    Prior to Admission medications   Medication Sig Start Date End Date Taking? Authorizing Provider  ALPRAZolam (XANAX) 1 MG tablet TAKE ONE-HALF TABLET BY MOUTH IN THE MORNING,  ONE-HALF TABLET AT MIDDAY AS NEEDED, AND ONE TABLET AT BEDTIME AS NEEDED FOR SLEEP.   Yes Maurice MarchBarbara B Haruna Rohlfs, MD  citalopram (CELEXA) 40 MG tablet Take 1 tablet (40 mg total) by mouth every evening.   Yes Maurice MarchBarbara B Keno Caraway, MD  diphenhydrAMINE (BENADRYL) 25 mg capsule Take 25 mg by mouth at bedtime as needed.   Yes Historical Provider, MD  disulfiram (ANTABUSE) 250 MG tablet Take 1 tablet (250 mg total) by mouth every evening.   Yes Maurice MarchBarbara B Omega Slager, MD  ibuprofen (ADVIL,MOTRIN) 200 MG tablet Take 600 mg by mouth every 6 (six) hours as needed. For pain   Yes Historical Provider, MD    ROS: Negative for anorexia, abnormal weight loss, CP or palpitations, SOB or DOE, GI problems, dizziness, tremor, agitation, confusion, abnormal behavior or thoughts of self harm.  O: Filed Vitals:   03/19/14 1146  BP: 119/86  Pulse: 60  Temp: 97.6 F (36.4 C)  Resp: 16    GEN: In NAD: WN,WD. HENT: [place]/AT; EOMI w/ injected conj and clear sclerae. Moist oral mucosa. COR: RRR. LUNGS: CTA. ABD:  NT, no HSM or masses. SKIN: W&D; intact w/o diaphoresis or pallor or jaundice. NEURO: A&O x 3; CNs intact. Nonfocal. PSYCH: Appropriately conversant, calm and pleasant w/ good eye contact. Speech pattern and thought content normal. Judgement sound.  A/P: Stress due to illness of family member  Generalized anxiety disorder- Advised against obtaining alprazolam from non-medical sources. Advised about controlled substance policy.  History of substance abuse  Sinus headache  Meds ordered this encounter  Medications  . citalopram (CELEXA) 40 MG tablet    Sig: Take 1 tablet (40 mg total) by mouth every evening.    Dispense:  30 tablet    Refill:  5  . disulfiram (ANTABUSE) 250 MG tablet    Sig: Take 1 tablet (250 mg total) by mouth every evening.    Dispense:  30 tablet    Refill:  3  . ALPRAZolam (XANAX) 1 MG tablet    Sig: TAKE ONE-HALF TABLET BY MOUTH IN THE MORNING,  ONE-HALF TABLET AT MIDDAY AS NEEDED, AND ONE TABLET AT BEDTIME AS NEEDED FOR SLEEP.    Dispense:  60 tablet    Refill:  1

## 2014-04-02 DIAGNOSIS — Z0271 Encounter for disability determination: Secondary | ICD-10-CM

## 2014-05-17 ENCOUNTER — Telehealth: Payer: Self-pay | Admitting: *Deleted

## 2014-05-17 ENCOUNTER — Telehealth: Payer: Self-pay

## 2014-05-17 NOTE — Telephone Encounter (Signed)
Patient needs refill on her Alprazolam 1 mg [facility])

## 2014-05-17 NOTE — Telephone Encounter (Signed)
Patient needs a refill on her ALPRAZolam Prudy Feeler(XANAX) 1 MG tablet stated that she is completely out of the meds and that she has seizures if she doesn't have them and doesn't know what to do other than try and buy them off the street. Stated she is going through withdrawals really bad. She would like someone to call her back as soon as they can. Told the patient that if she needed them tonight she would need to come in and be seen, she then stated that she is taking care of her elderly grandfather and cannot leave him alone to come in to be seen tonight or else she would. Wants to know if there is anyway to get a few days of the meds so that she can find someone to watch her grandfather and she can come in to be seen.  Her call back number is [phone].  Marking high priority because of the seizure issues, want someone in clinical to make the judgement call on what to do.

## 2014-05-18 MED ORDER — ALPRAZOLAM 1 MG PO TABS: ORAL_TABLET | ORAL | Status: DC

## 2014-05-18 NOTE — Telephone Encounter (Signed)
[facility] to [facility]. I spoke with pt and advised that she will need to sch OV w/ Deboraha Sprangebbie Gessner, FNP in late July or early Aug 2016 in order to get more refills. [place] in [place], [place] with her elderly grandfather and can not come in monthly or every other month (dificult to make arrangements for someone to be w/ him in her absence). She will call back to sch appt w/ D.Leone PayorGessner, FNP.

## 2014-05-18 NOTE — Telephone Encounter (Signed)
I spoke with pt about this medication and her situation w/ her grandfather. Medication refilled at [facility] in [place]. Pt aware. She is advised to sch OV w/ D. Leone PayorGessner, FNP in late July or early August to get established.

## 2014-07-19 ENCOUNTER — Telehealth: Payer: Self-pay

## 2014-07-19 NOTE — Telephone Encounter (Signed)
Received RF request for alprazolam. Spoke to pt bc she had been advised last month that she needs OV to est w/new provider. She stated that she doesn't need a RF until next month, but did want to go ahead and set up appt. Transferred her to clerical to sch.

## 2014-08-11 ENCOUNTER — Encounter: Payer: Self-pay | Admitting: Internal Medicine

## 2014-08-11 ENCOUNTER — Ambulatory Visit (INDEPENDENT_AMBULATORY_CARE_PROVIDER_SITE_OTHER): Payer: Self-pay | Admitting: Internal Medicine

## 2014-08-11 VITALS — BP 122/87 | HR 100 | Temp 98.2°F | Resp 18 | Ht 66.5 in | Wt 130.0 lb

## 2014-08-11 DIAGNOSIS — L0100 Impetigo, unspecified: Secondary | ICD-10-CM

## 2014-08-11 DIAGNOSIS — Z6379 Other stressful life events affecting family and household: Secondary | ICD-10-CM

## 2014-08-11 DIAGNOSIS — L301 Dyshidrosis [pompholyx]: Secondary | ICD-10-CM

## 2014-08-11 DIAGNOSIS — J309 Allergic rhinitis, unspecified: Secondary | ICD-10-CM

## 2014-08-11 DIAGNOSIS — G4700 Insomnia, unspecified: Secondary | ICD-10-CM

## 2014-08-11 DIAGNOSIS — F1021 Alcohol dependence, in remission: Secondary | ICD-10-CM

## 2014-08-11 DIAGNOSIS — R21 Rash and other nonspecific skin eruption: Secondary | ICD-10-CM

## 2014-08-11 DIAGNOSIS — F411 Generalized anxiety disorder: Secondary | ICD-10-CM

## 2014-08-11 DIAGNOSIS — K130 Diseases of lips: Secondary | ICD-10-CM

## 2014-08-11 MED ORDER — ALPRAZOLAM 1 MG PO TABS: ORAL_TABLET | ORAL | Status: DC

## 2014-08-11 MED ORDER — TRAZODONE HCL 50 MG PO TABS: 50.0000 mg | ORAL_TABLET | Freq: Every evening | ORAL | Status: DC | PRN

## 2014-08-11 MED ORDER — CEPHALEXIN 500 MG PO CAPS: 500.0000 mg | ORAL_CAPSULE | Freq: Three times a day (TID) | ORAL | Status: DC

## 2014-08-11 MED ORDER — TRIAMCINOLONE ACETONIDE 0.1 % EX CREA: 1.0000 "application " | TOPICAL_CREAM | Freq: Two times a day (BID) | CUTANEOUS | Status: DC

## 2014-08-11 NOTE — Progress Notes (Signed)
Subjective:    Patient ID: Glenda [name], female    DOB: [birth year], 34 y.o.   MRN: [id number]  HPI Patient presents today for follow up of GAD. She is still living with and taking care of her grandfather. She does not have any help with him and he must be under 24 hour care. Sh e is under a great deal of stress. She stopped her anabuse, but has not had any alcohol. She is having a lot of financial stress, has applied and received food stamps and has tried to get health care assistance in [place] at the free clinic but was told she needed proof of no income. It is difficult for her to get to [facility] from [facility].   Is having more trouble with her allergies. Has noticed itching and redness that she thinks is eczema on her right great toe and left heel. She has had this before. Right great toe painful. Has tried hydrocortisone 2.5 %, neosporin, cetaphil without relief.Eyes itchy and she is concerned she has spread something from her feet to her eyes. Takes benadryl 50 mg at bedtime. Is able to sleep with this and xanax 2 mg.   Past Medical History  Diagnosis Date  . Bronchitis   . Asthma   . Depression   . PTSD (post-traumatic stress disorder)   . 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 Polysubstance abuse   . Bacterial vaginosis   . Seizures   . Bell's palsy    Past Surgical History  Procedure Laterality Date  . Cyst removed      from forehead   Family History  Problem Relation Age of Onset  . Mental illness Mother   . Stroke Mother   . Mental illness Father   . Cancer Father   . Cancer Maternal Grandmother   . Cancer Maternal Grandfather   . Heart disease Maternal Grandfather   . Cancer Paternal Grandfather    History  Substance Use Topics  . Smoking status: Current Every Day Smoker -- 1.00 packs/day for 14 years    Types: Cigarettes  . Smokeless tobacco: Never Used  . Alcohol Use: No     Comment:  social  Medications, allergies, past medical history, surgical history, family history, social history and problem list reviewed and updated.  Review of Systems As per HPI and: No chest pain or SOB, occasional palpitations, has headaches in the evenings- takes aspirin, watery nasal drainage, no sore throat, no cough, no ear pain.     Objective:   Physical Exam  Constitutional: She is oriented to person, place, and time. She appears well-developed and well-nourished.  HENT:  Head: Normocephalic and atraumatic.  Eyes: Conjunctivae are normal.  Neck: Normal range of motion. Neck supple.  Cardiovascular: Normal rate.   Pulmonary/Chest: Effort normal.  Musculoskeletal: Normal range of motion.  Neurological: She is alert and oriented to person, place, and time.  Skin: Skin is warm and dry.  Right great toe with yellow crusting imposed on erythema.  Also on second toe to lesser degree. Corners of lips with small fissures.   Psychiatric: She has a normal mood and affect. Her behavior is normal. Judgment and thought content normal.  Very tearful.  Vitals reviewed.  BP 122/87 mmHg  Pulse 100  Temp(Src) 98.2 F (36.8 C)  Resp 18  Ht 5' 6.5" (1.689 m)  Wt 130 lb (58.968 kg)  BMI 20.67 kg/m2  LMP 08/10/2014 Wt Readings from Last 3 Encounters:  08/11/14 130 lb (58.968 kg)  03/19/14 128 lb (58.06 kg)  01/27/14 130 lb (58.968 kg)      Assessment & Plan:  Patient seen by Dr. Merla Riches 1. Stress due to illness of family member - provided contact info for senior resources and encouraged her to explore adult day care/respite care  2. Generalized anxiety disorder - ALPRAZolam (XANAX) 1 MG tablet; TAKE ONE-HALF TABLET BY MOUTH IN THE MORNING,  ONE-HALF TABLET AT MIDDAY AS NEEDED, AND ONE TABLET AT BEDTIME AS NEEDED FOR SLEEP.  Dispense: 60 tablet; Refill: 2 - traZODone (DESYREL) 50 MG tablet; Take 1 tablet (50 mg total) by mouth at bedtime as needed for sleep.  Dispense: 30 tablet; Refill:  3  3. Allergic rhinitis, unspecified allergic rhinitis type - Provided written and verbal information regarding diagnosis and treatment  4. Rash - Likely impetigo secondary to scratching/eczema  5. Impetigo - cephALEXin (KEFLEX) 500 MG capsule; Take 1 capsule (500 mg total) by mouth 3 (three) times daily.  Dispense: 21 capsule; Refill: 0  6. Eczema, dyshidrotic - triamcinolone 0.1 % when she finishes keflex  7. Angular cheilitis - suggested OTC lotrimin BID x 5 days  8. Personal history of alcoholism - Provided encouragement for continued sobriety  9. Insomnia - encouraged her to stop benadryl/tylenol PM - traZODone (DESYREL) 50 MG tablet; Take 1 tablet (50 mg total) by mouth at bedtime as needed for sleep.  Dispense: 30 tablet; Refill: 3  Olean Ree, FNP-BC  [facility], [facility]  08/11/2014 10:56 PM I have participated in the care of this patient with the Advanced Practice Provider and agree with Diagnosis and Plan as documented. Robert P. Merla Riches, M.D.

## 2014-08-11 NOTE — Patient Instructions (Addendum)
For senior citizen resources try- [employer]  For allergies try over the counter claritin or zyrtec (generic fine) Try saline nasal spray several times a day For rash on corner of mouth- use over the counter lotrimin very sparingly twice a day for 5 days Take trazadone for sleep at bedtime- stop benadryl/ tylenol pm   Allergic Rhinitis Allergic rhinitis is when the mucous membranes in the nose respond to allergens. Allergens are particles in the air that cause your body to have an allergic reaction. This causes you to release allergic antibodies. Through a chain of events, these eventually cause you to release histamine into the blood stream. Although meant to protect the body, it is this release of histamine that causes your discomfort, such as frequent sneezing, congestion, and an itchy, runny nose.  CAUSES  Seasonal allergic rhinitis (hay fever) is caused by pollen allergens that may come from grasses, trees, and weeds. Year-round allergic rhinitis (perennial allergic rhinitis) is caused by allergens such as house dust mites, pet dander, and mold spores.  SYMPTOMS   Nasal stuffiness (congestion).  Itchy, runny nose with sneezing and tearing of the eyes. DIAGNOSIS  Your health care provider can help you determine the allergen or allergens that trigger your symptoms. If you and your health care provider are unable to determine the allergen, skin or blood testing may be used. TREATMENT  Allergic rhinitis does not have a cure, but it can be controlled by:  Medicines and allergy shots (immunotherapy).  Avoiding the allergen. Hay fever may often be treated with antihistamines in pill or nasal spray forms. Antihistamines block the effects of histamine. There are over-the-counter medicines that may help with nasal congestion and swelling around the eyes. Check with your health care provider before taking or giving this medicine.  If avoiding the allergen or the medicine prescribed do not  work, there are many new medicines your health care provider can prescribe. Stronger medicine may be used if initial measures are ineffective. Desensitizing injections can be used if medicine and avoidance does not work. Desensitization is when a patient is given ongoing shots until the body becomes less sensitive to the allergen. Make sure you follow up with your health care provider if problems continue. HOME CARE INSTRUCTIONS It is not possible to completely avoid allergens, but you can reduce your symptoms by taking steps to limit your exposure to them. It helps to know exactly what you are allergic to so that you can avoid your specific triggers. SEEK MEDICAL CARE IF:   You have a fever.  You develop a cough that does not stop easily (persistent).  You have shortness of breath.  You start wheezing.  Symptoms interfere with normal daily activities. Document Released: 09/26/2000 Document Revised: 01/06/2013 Document Reviewed: 09/08/2012 [facility] Patient Information 2015 [place], [place]. This information is not intended to replace advice given to you by your health care provider. Make sure you discuss any questions you have with your health care provider. Eczema Eczema, also called atopic dermatitis, is a skin disorder that causes inflammation of the skin. It causes a red rash and dry, scaly skin. The skin becomes very itchy. Eczema is generally worse during the cooler winter months and often improves with the warmth of summer. Eczema usually starts showing signs in infancy. Some children outgrow eczema, but it may last through adulthood.  CAUSES  The exact cause of eczema is not known, but it appears to run in families. People with eczema often have a family history of eczema,  allergies, asthma, or hay fever. Eczema is not contagious. Flare-ups of the condition may be caused by:   Contact with something you are sensitive or allergic to.   Stress. SIGNS AND SYMPTOMS  Dry, scaly skin.    Red, itchy rash.   Itchiness. This may occur before the skin rash and may be very intense.  DIAGNOSIS  The diagnosis of eczema is usually made based on symptoms and medical history. TREATMENT  Eczema cannot be cured, but symptoms usually can be controlled with treatment and other strategies. A treatment plan might include:  Controlling the itching and scratching.   Use over-the-counter antihistamines as directed for itching. This is especially useful at night when the itching tends to be worse.   Use over-the-counter steroid creams as directed for itching.   Avoid scratching. Scratching makes the rash and itching worse. It may also result in a skin infection (impetigo) due to a break in the skin caused by scratching.   Keeping the skin well moisturized with creams every day. This will seal in moisture and help prevent dryness. Lotions that contain alcohol and water should be avoided because they can dry the skin.   Limiting exposure to things that you are sensitive or allergic to (allergens).   Recognizing situations that cause stress.   Developing a plan to manage stress.  HOME CARE INSTRUCTIONS   Only take over-the-counter or prescription medicines as directed by your health care provider.   Do not use anything on the skin without checking with your health care provider.   Keep baths or showers short (5 minutes) in warm (not hot) water. Use mild cleansers for bathing. These should be unscented. You may add nonperfumed bath oil to the bath water. It is best to avoid soap and bubble bath.   Immediately after a bath or shower, when the skin is still damp, apply a moisturizing ointment to the entire body. This ointment should be a petroleum ointment. This will seal in moisture and help prevent dryness. The thicker the ointment, the better. These should be unscented.   Keep fingernails cut short. Children with eczema may need to wear soft gloves or mittens at night  after applying an ointment.   Dress in clothes made of cotton or cotton blends. Dress lightly, because heat increases itching.   A child with eczema should stay away from anyone with fever blisters or cold sores. The virus that causes fever blisters (herpes simplex) can cause a serious skin infection in children with eczema. SEEK MEDICAL CARE IF:   Your itching interferes with sleep.   Your rash gets worse or is not better within 1 week after starting treatment.   You see pus or soft yellow scabs in the rash area.   You have a fever.   You have a rash flare-up after contact with someone who has fever blisters.  Document Released: 12/30/1999 Document Revised: 10/22/2012 Document Reviewed: 08/04/2012 [facility] Patient Information 2015 [place], [place]. This information is not intended to replace advice given to you by your health care provider. Make sure you discuss any questions you have with your health care provider.

## 2014-08-16 NOTE — Progress Notes (Signed)
[id number]

## 2014-11-05 ENCOUNTER — Telehealth: Payer: Self-pay

## 2014-11-05 DIAGNOSIS — F411 Generalized anxiety disorder: Secondary | ICD-10-CM

## 2014-11-05 NOTE — Telephone Encounter (Signed)
Glenda [name]   Patient wants you to call her.  She would not give any additional information   [phone] (H)

## 2014-11-06 NOTE — Telephone Encounter (Signed)
Called patient, left her a message that I will try her later.

## 2014-11-08 MED ORDER — CITALOPRAM HYDROBROMIDE 40 MG PO TABS: 40.0000 mg | ORAL_TABLET | Freq: Every evening | ORAL | Status: DC

## 2014-11-08 MED ORDER — ALPRAZOLAM 1 MG PO TABS: ORAL_TABLET | ORAL | Status: DC

## 2014-11-08 NOTE — Telephone Encounter (Signed)
Called patient who is requesting refills of citalopram and Xanax. She reports her eczema has improved. I called in Xanax refill and sent citalopram to pharmacy. She will need to follow up in 3 months.

## 2014-12-16 ENCOUNTER — Other Ambulatory Visit: Payer: Self-pay | Admitting: Family Medicine

## 2015-02-04 ENCOUNTER — Other Ambulatory Visit: Payer: Self-pay | Admitting: Family Medicine

## 2015-12-14 ENCOUNTER — Emergency Department (HOSPITAL_COMMUNITY)
Admission: EM | Admit: 2015-12-14 | Discharge: 2015-12-14 | Disposition: A | Discharge status: Home / Self Care | Payer: Self-pay | Attending: Dermatology | Admitting: Dermatology

## 2015-12-14 ENCOUNTER — Encounter (HOSPITAL_COMMUNITY): Payer: Self-pay | Admitting: *Deleted

## 2015-12-14 DIAGNOSIS — Z5321 Procedure and treatment not carried out due to patient leaving prior to being seen by health care provider: Secondary | ICD-10-CM | POA: Insufficient documentation

## 2015-12-14 DIAGNOSIS — F17210 Nicotine dependence, cigarettes, uncomplicated: Secondary | ICD-10-CM | POA: Insufficient documentation

## 2015-12-14 DIAGNOSIS — Z791 Long term (current) use of non-steroidal anti-inflammatories (NSAID): Secondary | ICD-10-CM | POA: Insufficient documentation

## 2015-12-14 DIAGNOSIS — J45909 Unspecified asthma, uncomplicated: Secondary | ICD-10-CM | POA: Insufficient documentation

## 2015-12-14 DIAGNOSIS — R51 Headache: Secondary | ICD-10-CM | POA: Insufficient documentation

## 2015-12-14 NOTE — ED Triage Notes (Signed)
Pt went to registration desk and informed reg clerk Barbara Cower(Jason) that she was going to leave.

## 2015-12-14 NOTE — ED Triage Notes (Signed)
C/o headache for the past 5 weeks, worse today with nausea,

## 2016-06-15 ENCOUNTER — Ambulatory Visit: Payer: Self-pay | Admitting: Family Medicine

## 2017-02-06 ENCOUNTER — Encounter (HOSPITAL_COMMUNITY): Payer: Self-pay

## 2017-02-06 ENCOUNTER — Emergency Department (HOSPITAL_COMMUNITY)
Admission: EM | Admit: 2017-02-06 | Discharge: 2017-02-06 | Disposition: A | Discharge status: Home / Self Care | Payer: Self-pay | Attending: Emergency Medicine | Admitting: Emergency Medicine

## 2017-02-06 ENCOUNTER — Other Ambulatory Visit: Payer: Self-pay

## 2017-02-06 DIAGNOSIS — J45909 Unspecified asthma, uncomplicated: Secondary | ICD-10-CM | POA: Insufficient documentation

## 2017-02-06 DIAGNOSIS — F411 Generalized anxiety disorder: Secondary | ICD-10-CM

## 2017-02-06 DIAGNOSIS — F17210 Nicotine dependence, cigarettes, uncomplicated: Secondary | ICD-10-CM | POA: Insufficient documentation

## 2017-02-06 DIAGNOSIS — Z760 Encounter for issue of repeat prescription: Secondary | ICD-10-CM | POA: Insufficient documentation

## 2017-02-06 DIAGNOSIS — G479 Sleep disorder, unspecified: Secondary | ICD-10-CM | POA: Insufficient documentation

## 2017-02-06 DIAGNOSIS — Z79899 Other long term (current) drug therapy: Secondary | ICD-10-CM | POA: Insufficient documentation

## 2017-02-06 MED ORDER — CITALOPRAM HYDROBROMIDE 40 MG PO TABS: 40.0000 mg | ORAL_TABLET | Freq: Every evening | ORAL | 0 refills | Status: DC

## 2017-02-06 MED ORDER — ALPRAZOLAM 1 MG PO TABS: 1.0000 mg | ORAL_TABLET | Freq: Two times a day (BID) | ORAL | 0 refills | Status: AC | PRN

## 2017-02-06 MED ORDER — METOPROLOL TARTRATE 50 MG PO TABS: 50.0000 mg | ORAL_TABLET | Freq: Two times a day (BID) | ORAL | 0 refills | Status: DC

## 2017-02-06 NOTE — ED Triage Notes (Signed)
Patient need daily medications filled. Does not have PCP. Last seen by PCP in [place]

## 2017-02-06 NOTE — Discharge Instructions (Signed)
You have been prescribed medication to help you until you can establish primary care and psychiatric care as needed.

## 2017-02-07 NOTE — ED Provider Notes (Signed)
[facility] EMERGENCY DEPARTMENT Provider Note   CSN: [id number] Arrival date & time: 02/06/17  1559     History   Chief Complaint Chief Complaint  Patient presents with  . Medication Refill    HPI Glenda [name] is a 37 y.o. female.  The history is provided by the patient.  Medication Refill  Medications/supplies requested:  Celexa, xanax, metoprolol, seroquel. Reason for request:  Medications ran out Medications taken before: yes - see home medications   Patient has complete original prescription information: no   Source of information:  Pharmacy  Confirmed medications per pharmacy tech call to pt's pharmacy.  She recently moved back here from [facility] to stay with family and ran out of her medicines within the past 2 weeks.  She is anticipating establishing care with Dr Margo Aye for ongoing care.   Past Medical History:  Diagnosis Date  . Asthma   . Bacterial vaginosis   . Bell's palsy   . Bronchitis   . Depression   . Polysubstance abuse (HCC)   . PTSD (post-traumatic stress disorder)   . Seizures (HCC)    vs pseudoseizures ("treated with xanax for the past 11 years")  . Seizures [facility])     Patient Active Problem List   Diagnosis Date Noted  . History of substance abuse 03/26/2013  . Personal history of alcoholism (HCC) 03/26/2013  . Anxiety disorder 03/26/2013    Past Surgical History:  Procedure Laterality Date  . cyst removed     from forehead    OB History    No data available       Home Medications    Prior to Admission medications   Medication Sig Start Date End Date Taking? Authorizing Provider  diphenhydrAMINE (BENADRYL) 25 mg capsule Take 25 mg by mouth at bedtime as needed.   Yes [provider]  ibuprofen (ADVIL,MOTRIN) 200 MG tablet Take 600 mg by mouth every 6 (six) hours as needed. For pain   Yes [provider]  QUEtiapine (SEROQUEL) 100 MG tablet Take 100 mg by mouth every evening.   Yes [provider]    ALPRAZolam Prudy Feeler) 1 MG tablet Take 1 tablet (1 mg total) by mouth 2 (two) times daily as needed. 02/06/17   Burgess Amor, PA-C  citalopram (CELEXA) 40 MG tablet Take 1 tablet (40 mg total) by mouth every evening. 02/06/17   Lisabeth Mian, Raynelle Fanning, PA-C  metoprolol tartrate (LOPRESSOR) 50 MG tablet Take 1 tablet (50 mg total) by mouth 2 (two) times daily. 02/06/17   Burgess Amor, PA-C    Family History Family History  Problem Relation Age of Onset  . Mental illness Mother   . Stroke Mother   . Mental illness Father   . Cancer Father   . Cancer Maternal Grandmother   . Cancer Maternal Grandfather   . Heart disease Maternal Grandfather   . Cancer Paternal Grandfather     Social History Social History   Tobacco Use  . Smoking status: Current Every Day Smoker    Packs/day: 1.00    Years: 14.00    Pack years: 14.00    Types: Cigarettes  . Smokeless tobacco: Never Used  Substance Use Topics  . Alcohol use: No    Comment: social  . Drug use: Yes    Types: Marijuana     Allergies   Alcohol-sulfur [sulfur] and Prednisone   Review of Systems Review of Systems  Constitutional: Negative for fever.  HENT: Negative for congestion and sore throat.  Eyes: Negative.   Respiratory: Negative for chest tightness and shortness of breath.   Cardiovascular: Negative for chest pain.  Gastrointestinal: Negative for abdominal pain and nausea.  Genitourinary: Negative.   Musculoskeletal: Negative for arthralgias, joint swelling and neck pain.  Skin: Negative.  Negative for rash and wound.  Neurological: Negative for dizziness, weakness, light-headedness, numbness and headaches.  Psychiatric/Behavioral: Positive for sleep disturbance. Negative for self-injury and suicidal ideas. The patient is nervous/anxious.      Physical Exam Updated Vital Signs BP (!) 122/92 (BP Location: Right Arm)   Pulse (!) 56   Temp 98.1 F (36.7 C) (Temporal)   Resp 18   Ht 5\' 6"  (1.676 m)   Wt 59 kg (130 lb)   LMP  01/06/2017   SpO2 94%   BMI 20.98 kg/m   Physical Exam  Constitutional: She appears well-developed and well-nourished.  HENT:  Head: Normocephalic and atraumatic.  Eyes: Conjunctivae are normal.  Neck: Normal range of motion.  Cardiovascular: Normal rate, regular rhythm, normal heart sounds and intact distal pulses.  Pulmonary/Chest: Effort normal and breath sounds normal. She has no wheezes.  Abdominal: Soft. Bowel sounds are normal. There is no tenderness.  Musculoskeletal: Normal range of motion.  Neurological: She is alert.  Skin: Skin is warm and dry.  Psychiatric: Her speech is normal and behavior is normal. Her mood appears anxious. She expresses no homicidal and no suicidal ideation.  Nursing note and vitals reviewed.    ED Treatments / Results  Labs (all labs ordered are listed, but only abnormal results are displayed) Labs Reviewed - No data to display  EKG  EKG Interpretation None       Radiology No results found.  Procedures Procedures (including critical care time)  Medications Ordered in ED Medications - No data to display   Initial Impression / Assessment and Plan / ED Course  I have reviewed the triage vital signs and the nursing notes.  Pertinent labs & imaging results that were available during my care of the patient were reviewed by me and considered in my medical decision making (see chart for details).     [place] controlled substance database reviewed. Pt received last prescription for xanax early December ([facility]  She was given a 2 week supply of celexa, metoprolol and a few tabs of xanax which she was advised to use sparingly.  She was [facility] if unable to get established with pcp within the next 2 weeks, referrals given.  Final Clinical Impressions(s) / ED Diagnoses   Final diagnoses:  Medication refill    ED Discharge Orders        Ordered    citalopram (CELEXA) 40  MG tablet  Every evening     02/06/17 1810    ALPRAZolam (XANAX) 1 MG tablet  2 times daily PRN     02/06/17 1810    metoprolol tartrate (LOPRESSOR) 50 MG tablet  2 times daily     02/06/17 1810       Burgess Amordol, Natan Hartog, PA-C 02/07/17 0018    Eber HongMiller, Brian, MD 02/08/17 1030

## 2017-08-26 LAB — CBC
Hematocrit: 44.4 % (ref 34.0–47.0)
Hemoglobin: 15.1 g/dL (ref 11.5–15.7)
MCH: 32.2 pg (ref 27.0–34.5)
MCHC: 33.9 g/dL (ref 32.0–36.0)
MCV: 94.9 fL (ref 81.0–99.0)
MPV: 8.8 fL (ref 7.2–11.2)
Platelets: 248 10*3/uL (ref 140–440)
RBC: 4.68 x10e6/mcL (ref 3.60–5.20)
RDW: 14.1 % (ref 11.0–16.0)
WBC: 9.7 10*3/uL (ref 3.8–10.6)

## 2017-08-26 LAB — POC PREGNANCY UR-QUAL: Preg Test, Ur: NEGATIVE

## 2017-08-26 LAB — BASIC METABOLIC PANEL
Anion Gap: 13 mmol/L (ref 2–17)
BUN: 8 mg/dL (ref 6–20)
CO2: 26 mmol/L (ref 22–29)
Calcium: 8.9 mg/dL (ref 8.6–10.0)
Chloride: 106 mmol/L (ref 98–107)
Creatinine: 0.7 mg/dL (ref 0.5–0.9)
GFR African American: 131 (ref >=90)
GFR Non-African American: 113 (ref >=90)
Glucose: 82 mg/dL (ref 70–99)
Potassium: 3.9 mmol/L (ref 3.5–5.3)
Sodium: 145 mmol/L (ref 135–145)

## 2017-08-26 LAB — ETHANOL: Ethanol Lvl: 214.2 mg/dL — ABNORMAL HIGH (ref 0.0–10.0)

## 2017-08-26 LAB — URINE DRUG SCREEN
Amphetamine Screen, Urine: NEGATIVE
Barbiturate Screen, Ur: NEGATIVE
Benzodiazepine Screen, Urine: POSITIVE — AB
Cannabinoid Scrn, Ur: POSITIVE — AB
Cocaine Screen Urine: NEGATIVE
Opiate Screen, Urine: NEGATIVE
PCP Screen, Urine: NEGATIVE

## 2017-08-26 LAB — SALICYLATE LEVEL: Salicylate Lvl: <0.3 mg/dL — ABNORMAL LOW (ref 3.0–20.0)

## 2017-08-26 LAB — ACETAMINOPHEN LEVEL: Acetaminophen Level: <5 ug/mL — ABNORMAL LOW (ref 10.0–30.0)

## 2017-08-26 NOTE — Nursing Note (Signed)
Medication Administration Follow Up-Text       Medication Administration Follow Up Entered On:  08/26/2017 3:33 EDT    Performed On:  08/26/2017 3:33 EDT by Lenor DerrickBaran, RN, Allie      Intervention Information:     lorazepam  Performed by Lenor DerrickBaran, RN, Allie on 08/26/2017 02:22:00 EDT       lorazepam,2mg   Oral       Med Response   ED Medication Response :   No adverse reaction, Symptoms improved   Numeric Rating Pain Scale :   0 = No pain   Pasero Opioid Induced Sedation Scale :   2 = Slightly drowsy, easily aroused   Keturah BarreBaran, RN, Allie - 08/26/2017 3:33 EDT

## 2017-08-26 NOTE — Case Communication (Signed)
CM Discharge Planning Assessment - Text       CM Discharge Planning Assessment Entered On:  08/26/2017 9:49 EDT    Performed On:  08/26/2017 9:29 EDT by Clint BolderMelton,  Kimberly               Home Environment   Living Environment :   No Living Environment Information Available     Affect/Behavior :   Other: Unable to access   Lives With :   Significant other   Lives In :   Other: Pt and fiance' currently reside in hotel room while fiance' is Research scientist (physical sciences)completing construction contract.   Living Situation :   Home independently, Home with family support   Job Responsibilities :   Unemployed   Key Contact Information :   Bertram DenverMicheal Maulden - Fiance'  [phone]   Clint BolderMelton,  Kimberly - 08/26/2017 9:29 EDT   Home Environment   ADLs Prior to Admission :   Independent   Sensory Deficits :   None   Clint BolderMelton,  Kimberly - 08/26/2017 9:29 EDT   Discharge Needs I   Previously Documented Discharge Needs :   DISCHARGE PLAN/NEEDS:No discharge data available.  EQUIPMENT/TREATMENT NEEDS:No discharge data available.     Previously Documented Benefits Information :   No discharge data available.     Anticipated Discharge Date :   08/26/2017 EDT   CM Progress Note :   08/26/17 KRM - Pt is a 37 year old Caucasian female who presented to ED via EMS. Per report, pt contacted EMS herself reporting suicidal ideations. Per pt and finace', pt has experienced several deaths this past week, including her father, her 35 year old daughter and her dog. Pt has a hx of ETOH abuse and per results, tested positive for benzodiazipines and cannabinoids as well as ETOH. SWCM attempted to assess pt, however, she was asleep at time and not roused easily. Pt was able to provide corrected contact information for her fiance' Delmer IslamMichael Maulden and also [facility] him. Per fianceCasimiro Needle' Michael, pt has received treatment in past for ETOH abuse and she has experienced at least two deaths in the past week of which he is aware. Marchia MeiersFiance' stated that he was unaware that  pt's daughter, Henrene DodgeJaden, had died but did state that the daughter resided with her biological father, who had custody. Per fiance', who has been with pt for past 11 years, denied any knowledge of pt having previous hx of suicidal attempts. He did state that she suffers from night terrors and possible PTSD due to traumatic experience in teen years. Micheal stated that pt has a good support system at home in [place]. Both pt and [place]iance' are in [place] for fiance's job Conservation officer, historic buildings(construction) and that she does travel with him frequently. Marchia MeiersFiance' Casimiro NeedleMichael stated he does not believe pt to be a danger to herself or others and is willing to assist with safety plan if needed. SWCM provided Dr. Pollyann Kennedyosen with update on conversation with pt's fiance'. SWCM will discuss Crisis Stabilization with pt as voluntary admission, but if pt is able to contract for safety and her fiance' is able provide support and continues to deny pt is a danger to herself, SWCM will provide pt with otpt services. SWCM discussed plan w/ ED MD who is aligned. SWCM will wait for pt to be more cognizant/awake to discuss options and continue to follow for support, services as needed.      Clint BolderMelton,  Kimberly - 08/26/2017 9:29 EDT  Discharge Needs II   Discharge Planning Time Spent :   60 minutes   Clint BolderMelton,  Kimberly - 08/26/2017 9:29 EDT   Advance Directive   Advance Directive :   No   Clint BolderMelton,  Kimberly - 08/26/2017 9:29 EDT   Discharge Planning   Discharge Arrangements :       Patient Post-Acute Information    Patient Name: [name], [name]  MRN: [id number]  FIN: [id number]  Gender: Female  DOB: 14-Nov-1980  Age:  [age] Years        *** No Post-Acute Placement(s) Listed ***          *** No Post-Acute Service(s) Listed ***       Clint BolderMelton,  Kimberly - 08/26/2017 9:29 EDT   Discharge Planning Assessment Status   Discharge Planning Assessment Complete :   Yes   Clint BolderMelton,  Kimberly - 08/26/2017 9:29 EDT

## 2017-08-26 NOTE — Case Communication (Signed)
CM Discharge Planning Assessment - Text       CM Discharge Planning Ongoing Assessment Entered On:  08/26/2017 13:31 EDT    Performed On:  08/26/2017 13:25 EDT by Clint BolderMelton,  Kimberly               Discharge Needs I   Previously Documented Discharge Needs :   DISCHARGE PLAN/NEEDS:  Anticipated Discharge Date: 08/26/2017 - Clint BolderMelton,  Kimberly - 08/26/17 11:40:00  EQUIPMENT/TREATMENT NEEDS:       Previously Documented Benefits Information :   No discharge data available.     Anticipated Discharge Date :   08/26/2017 EDT   Anticipated Discharge Time Slot :   1400-1600   Discharge To :   Home with family support   Home Caregiver Name/Relationship CM :   Leeroy ChaMichael [name] - fiance'   Home Caregiver Phone Number :   [phone]   CM Progress Note :   08/26/17 KRM - Pt is a 37 year old Caucasian female who presented to ED via EMS. Per report, pt contacted EMS herself reporting suicidal ideations. Per pt and finace', pt has experienced several deaths this past week, including her father, her 35 year old daughter and her dog. Pt has a hx of ETOH abuse and per results, tested positive for benzodiazipines and cannabinoids as well as ETOH. SWCM attempted to assess pt, however, she was asleep at time and not roused easily. Pt was able to provide corrected contact information for her fiance' [name] [name] and also [facility]. Per fianceCasimiro [name]' [name], pt has received treatment in past for ETOH abuse and she has experienced at least two deaths in the past week of which he is aware. Marchia MeiersFiance' stated that he was unaware that pt's daughter, [name] [name], had died but did state that the daughter resided with her biological father, who had custody. Per fiance', who has been with pt for past 11 years, denied any knowledge of pt having previous hx of suicidal attempts. He did state that she suffers from night terrors and possible PTSD due to traumatic experience in teen years. Micheal stated that pt has a good support  system at home in [place]. Both pt and fiance' are in [place] for fiance's job Conservation officer, historic buildings(construction) and that she does travel with him frequently. Marchia MeiersFiance' [name] NeedleMichael stated he does not believe pt to be a danger to herself or others and is willing to assist with safety plan if needed. SWCM provided Dr. Pollyann Kennedyosen with update on conversation with pt's fiance'. SWCM will discuss Crisis Stabilization with pt as voluntary admission, but if pt is able to contract for safety and her fiance' is able provide support and continues to deny pt is a danger to herself, SWCM will provide pt with otpt services. SWCM discussed plan w/ ED MD who is aligned. SWCM will wait for pt to be more cognizant/awake to discuss options and continue to follow for support, services as needed.   **update** SWCM spoke with pt regarding [facility]. Pt verbalized agreement at this time. [facility] and awaiting [facility].   **update** [facility] and stated she does not want to go to another facility but is requesting otpt services. [facility] w/ Dr. Pollyann Kennedyosen, who stated this was acceptable. [facility] and provided pt demographic information for intake session. SWCM [facility] for pt to be added to dc paperwork. SWCM spoke with pt about [facility] referral and  when she is to arrive at clinic for intake assessment. No further CM needs have been identified at this time.      Melton,  Kimberly - 08/26/2017 13:25 EDT   Discharge Needs II   Special ServClint Bolderices and Community Resources :   Mental Health - [place]   Needs Assistance with Transportation :   No   Discharge Planning Time Spent :   180 minutes   Clint BolderMelton,  Kimberly - 08/26/2017 13:25 EDT   Benefits   Insurance Information :   Tarri GlennUnfunded   Melton,  Kimberly - 08/26/2017 13:25 EDT   Discharge Planning   Discharge Arrangements :       Patient Post-Acute Information    Patient Name: [name], [name]  MRN: [id number]  FIN: [id number]  Gender: Female   DOB: 11/17/1980  Age:  [age] Years        *** No Post-Acute Placement(s) Listed ***          *** No Post-Acute Service(s) Listed ***       Discharge Plan Discussion :   Discussed with patient, Discussed with family/caregiver, Patient disagrees with plan, Family agrees with plan   Clint BolderMelton,  Kimberly - 08/26/2017 13:25 EDT   Discharge Planning Assessment Status   Discharge Planning Assessment Complete :   Yes   Clint BolderMelton,  Kimberly - 08/26/2017 13:25 EDT

## 2017-08-26 NOTE — Case Communication (Signed)
CM Discharge Planning Assessment - Text       CM Discharge Planning Ongoing Assessment Entered On:  08/26/2017 11:41 EDT    Performed On:  08/26/2017 11:40 EDT by Clint BolderMelton,  Kimberly               Discharge Needs I   Previously Documented Discharge Needs :   DISCHARGE PLAN/NEEDS:  Anticipated Discharge Date: 08/26/2017 - Clint BolderMelton,  Kimberly - 08/26/17 09:29:00  EQUIPMENT/TREATMENT NEEDS:       Previously Documented Benefits Information :   No discharge data available.     Anticipated Discharge Date :   08/26/2017 EDT   CM Progress Note :   08/26/17 KRM - Pt is a 37 year old Caucasian female who presented to ED via EMS. Per report, pt contacted EMS herself reporting suicidal ideations. Per pt and finace', pt has experienced several deaths this past week, including her father, her 35 year old daughter and her dog. Pt has a hx of ETOH abuse and per results, tested positive for benzodiazipines and cannabinoids as well as ETOH. SWCM attempted to assess pt, however, she was asleep at time and not roused easily. Pt was able to provide corrected contact information for her fiance' Delmer IslamMichael Maulden and also provided [facility] with him. Per fianceCasimiro Needle' Michael, pt has received treatment in past for ETOH abuse and she has experienced at least two deaths in the past week of which he is aware. Marchia MeiersFiance' stated that he was unaware that pt's daughter, Henrene DodgeJaden, had died but did state that the daughter resided with her biological father, who had custody. Per fiance', who has been with pt for past 11 years, denied any knowledge of pt having previous hx of suicidal attempts. He did state that she suffers from night terrors and possible PTSD due to traumatic experience in teen years. Micheal stated that pt has a good support system at home in [place]. Both pt and fiance' are in [place] for fiance's job Conservation officer, historic buildings(construction) and that she does travel with him frequently. Marchia MeiersFiance' Casimiro NeedleMichael stated he does not believe pt to be a danger to herself or  others and is willing to assist with safety plan if needed. SWCM provided Dr. Pollyann Kennedyosen with update on conversation with pt's fiance'. SWCM will discuss Crisis Stabilization with pt as voluntary admission, but if pt is able to contract for safety and her fiance' is able provide support and continues to deny pt is a danger to herself, SWCM will provide pt with otpt services. SWCM discussed plan w/ ED MD who is aligned. SWCM will wait for pt to be more cognizant/awake to discuss options and continue to follow for support, services as needed.   **update** SWCM spoke with pt regarding [facility]. Pt verbalized agreement at this time. [facility] and [facility].      Clint BolderMelton,  Kimberly - 08/26/2017 11:40 EDT

## 2018-03-18 ENCOUNTER — Other Ambulatory Visit: Payer: Self-pay

## 2018-03-18 ENCOUNTER — Encounter (HOSPITAL_COMMUNITY): Payer: Self-pay | Admitting: Emergency Medicine

## 2018-03-18 ENCOUNTER — Emergency Department (HOSPITAL_COMMUNITY)
Admission: EM | Admit: 2018-03-18 | Discharge: 2018-03-18 | Disposition: A | Discharge status: Home / Self Care | Payer: Self-pay | Attending: Emergency Medicine | Admitting: Emergency Medicine

## 2018-03-18 ENCOUNTER — Emergency Department (HOSPITAL_COMMUNITY): Payer: Self-pay

## 2018-03-18 DIAGNOSIS — E876 Hypokalemia: Secondary | ICD-10-CM

## 2018-03-18 DIAGNOSIS — Z79899 Other long term (current) drug therapy: Secondary | ICD-10-CM | POA: Insufficient documentation

## 2018-03-18 DIAGNOSIS — J45909 Unspecified asthma, uncomplicated: Secondary | ICD-10-CM | POA: Insufficient documentation

## 2018-03-18 DIAGNOSIS — R569 Unspecified convulsions: Secondary | ICD-10-CM

## 2018-03-18 DIAGNOSIS — F13939 Sedative, hypnotic or anxiolytic use, unspecified with withdrawal, unspecified: Secondary | ICD-10-CM

## 2018-03-18 DIAGNOSIS — F13239 Sedative, hypnotic or anxiolytic dependence with withdrawal, unspecified: Secondary | ICD-10-CM | POA: Insufficient documentation

## 2018-03-18 DIAGNOSIS — F17210 Nicotine dependence, cigarettes, uncomplicated: Secondary | ICD-10-CM | POA: Insufficient documentation

## 2018-03-18 LAB — URINALYSIS, ROUTINE W REFLEX MICROSCOPIC
BILIRUBIN URINE: NEGATIVE
Glucose, UA: NEGATIVE mg/dL
Hgb urine dipstick: NEGATIVE
Ketones, ur: 20 mg/dL — AB
Leukocytes,Ua: NEGATIVE
NITRITE: NEGATIVE
Protein, ur: NEGATIVE mg/dL
Specific Gravity, Urine: 1.026 (ref 1.005–1.030)
pH: 6 (ref 5.0–8.0)

## 2018-03-18 LAB — CBC WITH DIFFERENTIAL/PLATELET
Abs Immature Granulocytes: 0.1 10*3/uL — ABNORMAL HIGH (ref 0.00–0.07)
BASOS ABS: 0.1 10*3/uL (ref 0.0–0.1)
Basophils Relative: 1 %
Eosinophils Absolute: 0.1 10*3/uL (ref 0.0–0.5)
Eosinophils Relative: 0 %
HCT: 50.6 % — ABNORMAL HIGH (ref 36.0–46.0)
Hemoglobin: 16.8 g/dL — ABNORMAL HIGH (ref 12.0–15.0)
Immature Granulocytes: 1 %
Lymphocytes Relative: 16 %
Lymphs Abs: 2.7 10*3/uL (ref 0.7–4.0)
MCH: 30.9 pg (ref 26.0–34.0)
MCHC: 33.2 g/dL (ref 30.0–36.0)
MCV: 93.2 fL (ref 80.0–100.0)
Monocytes Absolute: 0.9 10*3/uL (ref 0.1–1.0)
Monocytes Relative: 5 %
Neutro Abs: 13.2 10*3/uL — ABNORMAL HIGH (ref 1.7–7.7)
Neutrophils Relative %: 77 %
Platelets: 299 10*3/uL (ref 150–400)
RBC: 5.43 MIL/uL — ABNORMAL HIGH (ref 3.87–5.11)
RDW: 13.6 % (ref 11.5–15.5)
WBC: 17 10*3/uL — ABNORMAL HIGH (ref 4.0–10.5)
nRBC: 0 % (ref 0.0–0.2)

## 2018-03-18 LAB — MAGNESIUM: Magnesium: 1.9 mg/dL (ref 1.7–2.4)

## 2018-03-18 LAB — COMPREHENSIVE METABOLIC PANEL
ALBUMIN: 4.6 g/dL (ref 3.5–5.0)
ALT: 45 U/L — ABNORMAL HIGH (ref 0–44)
AST: 62 U/L — AB (ref 15–41)
Alkaline Phosphatase: 136 U/L — ABNORMAL HIGH (ref 38–126)
Anion gap: 10 (ref 5–15)
BUN: 10 mg/dL (ref 6–20)
CO2: 27 mmol/L (ref 22–32)
Calcium: 9.8 mg/dL (ref 8.9–10.3)
Chloride: 101 mmol/L (ref 98–111)
Creatinine, Ser: 1.03 mg/dL — ABNORMAL HIGH (ref 0.44–1.00)
GFR calc Af Amer: >60 mL/min (ref >60)
GFR calc non Af Amer: >60 mL/min (ref >60)
GLUCOSE: 157 mg/dL — AB (ref 70–99)
Potassium: 2.7 mmol/L — CL (ref 3.5–5.1)
Sodium: 138 mmol/L (ref 135–145)
Total Bilirubin: 1 mg/dL (ref 0.3–1.2)
Total Protein: 7.8 g/dL (ref 6.5–8.1)

## 2018-03-18 LAB — I-STAT BETA HCG BLOOD, ED (MC, WL, AP ONLY): I-stat hCG, quantitative: <5 m[IU]/mL (ref <5)

## 2018-03-18 LAB — I-STAT TROPONIN, ED: Troponin i, poc: 0 ng/mL (ref 0.00–0.08)

## 2018-03-18 LAB — RAPID URINE DRUG SCREEN, HOSP PERFORMED
Amphetamines: NOT DETECTED
Barbiturates: NOT DETECTED
Benzodiazepines: NOT DETECTED
COCAINE: NOT DETECTED
Opiates: NOT DETECTED
Tetrahydrocannabinol: POSITIVE — AB

## 2018-03-18 LAB — ACETAMINOPHEN LEVEL: Acetaminophen (Tylenol), Serum: <10 ug/mL — ABNORMAL LOW (ref 10–30)

## 2018-03-18 LAB — ETHANOL: Alcohol, Ethyl (B): <10 mg/dL (ref <10)

## 2018-03-18 LAB — SALICYLATE LEVEL: Salicylate Lvl: <7 mg/dL (ref 2.8–30.0)

## 2018-03-18 LAB — PREGNANCY, URINE: Preg Test, Ur: NEGATIVE

## 2018-03-18 MED ORDER — SODIUM CHLORIDE 0.9 % IV BOLUS
1000.0000 mL | Freq: Once | INTRAVENOUS | Status: AC
  Administered 2018-03-18: 1000 mL via INTRAVENOUS

## 2018-03-18 MED ORDER — POTASSIUM CHLORIDE CRYS ER 20 MEQ PO TBCR: 20.0000 meq | EXTENDED_RELEASE_TABLET | Freq: Every day | ORAL | 0 refills | Status: DC

## 2018-03-18 MED ORDER — ALPRAZOLAM 0.25 MG PO TABS
1.0000 mg | ORAL_TABLET | Freq: Once | ORAL | Status: AC
Start: 2018-03-18 — End: 2018-03-18
  Administered 2018-03-18: 1 mg via ORAL
  Filled 2018-03-18: qty 4

## 2018-03-18 MED ORDER — POTASSIUM CHLORIDE 10 MEQ/100ML IV SOLN
10.0000 meq | INTRAVENOUS | Status: AC
  Administered 2018-03-18 (×3): 10 meq via INTRAVENOUS
  Filled 2018-03-18 (×3): qty 100

## 2018-03-18 MED ORDER — POTASSIUM CHLORIDE CRYS ER 20 MEQ PO TBCR
40.0000 meq | EXTENDED_RELEASE_TABLET | Freq: Once | ORAL | Status: AC
  Administered 2018-03-18: 40 meq via ORAL
  Filled 2018-03-18: qty 2

## 2018-03-18 MED ORDER — CHLORDIAZEPOXIDE HCL 25 MG PO CAPS: ORAL_CAPSULE | ORAL | 0 refills | Status: DC

## 2018-03-18 MED ORDER — LORAZEPAM 2 MG/ML IJ SOLN
1.0000 mg | Freq: Once | INTRAMUSCULAR | Status: AC
  Administered 2018-03-18: 1 mg via INTRAVENOUS
  Filled 2018-03-18: qty 1

## 2018-03-18 NOTE — ED Provider Notes (Signed)
[facility] EMERGENCY DEPARTMENT Provider Note   CSN: [id number] Arrival date & time: 03/18/18  1626    History   Chief Complaint Chief Complaint  Patient presents with  . Seizures    HPI Glenda A Ringstad is a 38 y.o. 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  Patient states that she did refill her Xanax and of January and usually takes 1 mg twice daily.  She states that her last dose of Xanax was about 8 days ago.  She apparently was watching the TV and then had an episode of tonic-clonic jerking movement and that was witnessed by her boyfriend.  He states that it lasted about 5 minutes and then she went back to baseline.  He states that she had no recollection of this and she did bite her tongue but has no incontinence or postictal period.  Patient states that she had previous seizures secondary to the withdrawal as well. She drinks alcohol occasionally.      The history is provided by the patient.    Past Medical History:  Diagnosis Date  . Asthma   . Bacterial vaginosis   . Bell's palsy   . Bronchitis   . Depression   . Polysubstance abuse (HCC)   . PTSD (post-traumatic stress disorder)   . Seizures (HCC)    vs pseudoseizures ("treated with xanax for the past 11 years")  . Seizures [facility])     Patient Active Problem List   Diagnosis Date Noted  . History of substance abuse (HCC) 03/26/2013  . Personal history of alcoholism (HCC) 03/26/2013  . Anxiety disorder 03/26/2013    Past Surgical History:  Procedure Laterality Date  . cyst removed     from forehead     OB History   No obstetric history on file.      Home Medications    Prior to Admission medications   Medication Sig Start Date End Date Taking? Authorizing Provider  ALPRAZolam Prudy Feeler) 1 MG tablet Take 1 tablet (1 mg total) by mouth 2 (two) times daily as needed. Patient taking differently: Take 1 mg by mouth 2 (two) times  daily as needed for anxiety.  02/06/17  Yes Idol, Raynelle Fanning, PA-C  citalopram (CELEXA) 40 MG tablet Take 1 tablet (40 mg total) by mouth every evening. 02/06/17  Yes Idol, Raynelle Fanning, PA-C  diphenhydrAMINE (BENADRYL) 25 mg capsule Take 25 mg by mouth at bedtime as needed for sleep.    Yes [provider]  metoprolol tartrate (LOPRESSOR) 50 MG tablet Take 1 tablet (50 mg total) by mouth 2 (two) times daily. 02/06/17  Yes Idol, Raynelle Fanning, PA-C  Naphazoline-Pheniramine (OPCON-A) 0.027-0.315 % SOLN Place 1-2 drops into both eyes as needed (for itching).    Yes [provider]  QUEtiapine (SEROQUEL) 100 MG tablet Take 100 mg by mouth every evening.   Yes [provider]    Family History Family History  Problem Relation Age of Onset  . Mental illness Mother   . Stroke Mother   . Mental illness Father   . Cancer Father   . Cancer Maternal Grandmother   . Cancer Maternal Grandfather   . Heart disease Maternal Grandfather   . Cancer Paternal Grandfather     Social History Social History   Tobacco Use  . Smoking status: Current Every Day Smoker    Packs/day: 1.00    Years: 14.00    Pack years: 14.00    Types: Cigarettes  .  Smokeless tobacco: Never Used  Substance Use Topics  . Alcohol use: No    Comment: social  . Drug use: Yes    Types: Marijuana     Allergies   Prednisone and Penicillins   Review of Systems Review of Systems  Neurological: Positive for seizures.  All other systems reviewed and are negative.    Physical Exam Updated Vital Signs BP (!) 119/93   Pulse 96   Temp 98.4 F (36.9 C) (Oral)   Resp (!) 27   Ht  (1.676 m)   Wt 59 kg   SpO2 100%   BMI 20.98 kg/m   Physical Exam Vitals signs and nursing note reviewed.  Constitutional:      Appearance: Normal appearance.  HENT:     Head: Normocephalic.     Right Ear: Tympanic membrane normal.     Left Ear: Tympanic membrane normal.     Nose: Nose normal.     Mouth/Throat:     Mouth:  Mucous membranes are moist.     Comments: Small tongue laceration with bleeding controlled  Eyes:     Extraocular Movements: Extraocular movements intact.     Pupils: Pupils are equal, round, and reactive to light.  Neck:     Musculoskeletal: Normal range of motion.  Cardiovascular:     Rate and Rhythm: Regular rhythm. Tachycardia present.  Pulmonary:     Effort: Pulmonary effort is normal.     Breath sounds: Normal breath sounds.  Abdominal:     General: Abdomen is flat.     Palpations: Abdomen is soft.  Musculoskeletal: Normal range of motion.  Skin:    General: Skin is warm.     Capillary Refill: Capillary refill takes less than 2 seconds.  Neurological:     General: No focal deficit present.     Mental Status: She is alert and oriented to person, place, and time.     Comments: CN 2- 12 intact. Nl strength and sensation throughout, no eye deviation   Psychiatric:        Mood and Affect: Mood normal.      ED Treatments / Results  Labs (all labs ordered are listed, but only abnormal results are displayed) Labs Reviewed  CBC WITH DIFFERENTIAL/PLATELET - Abnormal; Notable for the following components:      Result Value   WBC 17.0 (*)    RBC 5.43 (*)    Hemoglobin 16.8 (*)    HCT 50.6 (*)    Neutro Abs 13.2 (*)    Abs Immature Granulocytes 0.10 (*)    All other components within normal limits  COMPREHENSIVE METABOLIC PANEL - Abnormal; Notable for the following components:   Potassium 2.7 (*)    Glucose, Bld 157 (*)    Creatinine, Ser 1.03 (*)    AST 62 (*)    ALT 45 (*)    Alkaline Phosphatase 136 (*)    All other components within normal limits  ACETAMINOPHEN LEVEL - Abnormal; Notable for the following components:   Acetaminophen (Tylenol), Serum <10 (*)    All other components within normal limits  URINALYSIS, ROUTINE W REFLEX MICROSCOPIC - Abnormal; Notable for the following components:   Color, Urine AMBER (*)    APPearance CLOUDY (*)    Ketones, ur 20 (*)      All other components within normal limits  RAPID URINE DRUG SCREEN, HOSP PERFORMED - Abnormal; Notable for the following components:   Tetrahydrocannabinol POSITIVE (*)    All other  components within normal limits  PREGNANCY, URINE  ETHANOL  SALICYLATE LEVEL  MAGNESIUM  I-STAT TROPONIN, ED  I-STAT BETA HCG BLOOD, ED ([facility], [facility], [facility] ONLY)    EKG EKG Interpretation  Date/Time:  Tuesday March 18 2018 16:41:22 EST Ventricular Rate:  139 PR Interval:    QRS Duration: 84 QT Interval:  358 QTC Calculation: 544 R Axis:   95 Text Interpretation:  Supraventricular tachycardia Rightward axis ST & Marked T wave abnormality, consider inferior ischemia Abnormal ECG svt vs sinus, difficult to assess. also has rated related changes. different than previous  Confirmed by Richardean Canal [phone]) on 03/18/2018 4:59:06 PM   Radiology Ct Head Wo Contrast  Result Date: 03/18/2018 CLINICAL DATA:  Two seizures today. Ran out of anti seizure medication. EXAM: CT HEAD WITHOUT CONTRAST TECHNIQUE: Contiguous axial images were obtained from the base of the skull through the vertex without intravenous contrast. COMPARISON:  05/15/2012 FINDINGS: Brain: Ventricles, cisterns and other CSF spaces are normal. There is no mass, mass effect, shift of midline structures or acute hemorrhage. No evidence of acute infarction. Vascular: No hyperdense vessel or unexpected calcification. Skull: Normal. Negative for fracture or focal lesion. Sinuses/Orbits: Orbits are normal. Paranasal sinuses are well developed and well aerated as there is minimal mucosal membrane thickening over the left sphenoid sinus. Mastoid air cells are clear. Other: None. IMPRESSION: No acute findings. Minimal chronic inflammatory change of the sphenoid sinus. Electronically Signed   By: Elberta Fortis M.D.   On: 03/18/2018 18:34    Procedures Procedures (including critical care time)  Medications Ordered in ED Medications  sodium chloride 0.9 % bolus 1,000  mL (0 mLs Intravenous Stopped 03/18/18 1847)  ALPRAZolam (XANAX) tablet 1 mg (1 mg Oral Given 03/18/18 1707)  LORazepam (ATIVAN) injection 1 mg (1 mg Intravenous Given 03/18/18 1838)  potassium chloride 10 mEq in 100 mL IVPB (10 mEq Intravenous New Bag/Given 03/18/18 2036)  potassium chloride SA (K-DUR,KLOR-CON) CR tablet 40 mEq (40 mEq Oral Given 03/18/18 1837)     Initial Impression / Assessment and Plan / ED Course  I have reviewed the triage vital signs and the nursing notes.  Pertinent labs & imaging results that were available during my care of the patient were reviewed by me and considered in my medical decision making (see chart for details).    Glenda [name] is a 38 y.o. female here with possible seizure. She stopped taking xanax about 8 days ago and likely has benzo withdrawal seizure. Will get labs, UDS, serum tox, CT head. She is also tachycardic on arrival so will give IVF and benzos and reassess.   10:52 PM Her K was 2.7, supplemented. Ate food in the ED. Observed for about 6 hrs and no further seizure activity. Tachycardia resolved with benzos. CT head unremarkable. I think she can be discharged with librium taper to help with benzo withdrawal.   Final Clinical Impressions(s) / ED Diagnoses   Final diagnoses:  None    ED Discharge Orders    None       Charlynne Pander, MD 03/18/18 2253

## 2018-03-18 NOTE — Discharge Instructions (Signed)
Take potassium daily for 3 days.   Take librium taper to help with benzo withdrawal   Stay hydrated   See your doctor for follow up   Return to ER if you have another seizure, tremors, hallucinations, vomiting, abdominal pain

## 2018-03-18 NOTE — ED Triage Notes (Signed)
Pt reports having 2 seizures today, states that she takes xanax for her seizures and she has been out x 2 weeks

## 2018-03-18 NOTE — ED Provider Notes (Signed)
MSE was initiated and I personally evaluated the patient and placed orders (if any) at  4:50 PM on March 18, 2018.  The patient appears stable so that the remainder of the MSE may be completed by another provider.  I evaluated the patient briefly in triage.  I was handed an EKG which showed tachycardia with rate of 145.  Suspect sinus rhythm but difficult to fully appreciate P waves given the rate.  Patient has had heart palpitations for 3 days constantly.  No shortness of breath or chest pain.  She has been off of her Xanax for the past 10 days.  She reports having 2 seizures and shows me abrasions on the sides of her tongue.  Suspect withdrawal clinically.  She tells me she takes 2 mg of Xanax.  I looked on the [place] drug database and she has been prescribed 1 mg Xanax with last fill in late January.  I plan to give her a single Xanax here and have started basic lab work and ordered an IV.  Nursing is looking for a bed in the acute care area.  Patient is awake, alert, well-appearing at this time.      Maia Plan, MD 03/18/18 [phone]

## 2018-09-11 ENCOUNTER — Emergency Department (HOSPITAL_COMMUNITY)
Admission: EM | Admit: 2018-09-11 | Discharge: 2018-09-11 | Disposition: A | Discharge status: Home / Self Care | Payer: Self-pay | Attending: Emergency Medicine | Admitting: Emergency Medicine

## 2018-09-11 ENCOUNTER — Other Ambulatory Visit: Payer: Self-pay

## 2018-09-11 ENCOUNTER — Encounter (HOSPITAL_COMMUNITY): Payer: Self-pay

## 2018-09-11 ENCOUNTER — Emergency Department (HOSPITAL_COMMUNITY): Payer: Self-pay

## 2018-09-11 DIAGNOSIS — E876 Hypokalemia: Secondary | ICD-10-CM | POA: Insufficient documentation

## 2018-09-11 DIAGNOSIS — F17210 Nicotine dependence, cigarettes, uncomplicated: Secondary | ICD-10-CM | POA: Insufficient documentation

## 2018-09-11 DIAGNOSIS — R002 Palpitations: Secondary | ICD-10-CM | POA: Insufficient documentation

## 2018-09-11 LAB — CBC WITH DIFFERENTIAL/PLATELET
Abs Immature Granulocytes: 0.1 10*3/uL — ABNORMAL HIGH (ref 0.00–0.07)
Basophils Absolute: 0.1 10*3/uL (ref 0.0–0.1)
Basophils Relative: 1 %
Eosinophils Absolute: 0.1 10*3/uL (ref 0.0–0.5)
Eosinophils Relative: 1 %
HCT: 49.9 % — ABNORMAL HIGH (ref 36.0–46.0)
Hemoglobin: 17.1 g/dL — ABNORMAL HIGH (ref 12.0–15.0)
Immature Granulocytes: 1 %
Lymphocytes Relative: 21 %
Lymphs Abs: 2.9 10*3/uL (ref 0.7–4.0)
MCH: 30.5 pg (ref 26.0–34.0)
MCHC: 34.3 g/dL (ref 30.0–36.0)
MCV: 89.1 fL (ref 80.0–100.0)
Monocytes Absolute: 1 10*3/uL (ref 0.1–1.0)
Monocytes Relative: 7 %
Neutro Abs: 9.8 10*3/uL — ABNORMAL HIGH (ref 1.7–7.7)
Neutrophils Relative %: 69 %
Platelets: 393 10*3/uL (ref 150–400)
RBC: 5.6 MIL/uL — ABNORMAL HIGH (ref 3.87–5.11)
RDW: 12.2 % (ref 11.5–15.5)
WBC: 14.1 10*3/uL — ABNORMAL HIGH (ref 4.0–10.5)
nRBC: 0 % (ref 0.0–0.2)

## 2018-09-11 LAB — COMPREHENSIVE METABOLIC PANEL WITH GFR
ALT: 95 U/L — ABNORMAL HIGH (ref 0–44)
AST: 124 U/L — ABNORMAL HIGH (ref 15–41)
Albumin: 4.4 g/dL (ref 3.5–5.0)
Alkaline Phosphatase: 114 U/L (ref 38–126)
Anion gap: 14 (ref 5–15)
BUN: 5 mg/dL — ABNORMAL LOW (ref 6–20)
CO2: 22 mmol/L (ref 22–32)
Calcium: 9.2 mg/dL (ref 8.9–10.3)
Chloride: 104 mmol/L (ref 98–111)
Creatinine, Ser: 0.87 mg/dL (ref 0.44–1.00)
GFR calc Af Amer: >60 mL/min (ref >60)
GFR calc non Af Amer: >60 mL/min (ref >60)
Glucose, Bld: 100 mg/dL — ABNORMAL HIGH (ref 70–99)
Potassium: 2.9 mmol/L — ABNORMAL LOW (ref 3.5–5.1)
Sodium: 140 mmol/L (ref 135–145)
Total Bilirubin: 1.1 mg/dL (ref 0.3–1.2)
Total Protein: 7.5 g/dL (ref 6.5–8.1)

## 2018-09-11 LAB — TSH: TSH: 1.865 u[IU]/mL (ref 0.350–4.500)

## 2018-09-11 LAB — MAGNESIUM: Magnesium: 2 mg/dL (ref 1.7–2.4)

## 2018-09-11 MED ORDER — ALPRAZOLAM 0.5 MG PO TABS
1.0000 mg | ORAL_TABLET | Freq: Once | ORAL | Status: AC
  Administered 2018-09-11: 13:00:00 1 mg via ORAL
  Filled 2018-09-11: qty 2

## 2018-09-11 MED ORDER — POTASSIUM CHLORIDE CRYS ER 20 MEQ PO TBCR: 40.0000 meq | EXTENDED_RELEASE_TABLET | Freq: Every day | ORAL | 0 refills | Status: AC

## 2018-09-11 MED ORDER — HYDROXYZINE HCL 25 MG PO TABS: 25.0000 mg | ORAL_TABLET | Freq: Four times a day (QID) | ORAL | 0 refills | Status: AC | PRN

## 2018-09-11 MED ORDER — SODIUM CHLORIDE 0.9 % IV BOLUS
1000.0000 mL | Freq: Once | INTRAVENOUS | Status: AC
  Administered 2018-09-11: 13:00:00 1000 mL via INTRAVENOUS

## 2018-09-11 NOTE — ED Triage Notes (Addendum)
Patient states she is having palpitations and dizziness. Denies anything to cause palpitations such as caffeine or drugs. Pt hx of IV drug use. Pt denies chest pain. States 'I'm not hurting, I can just feel my heart and it's going too damn fast".   EMS states patient HR varies between 120-160 bpm with BP 160/110.   Patient given 324 Aspirin en route to [facility] ED.

## 2018-09-11 NOTE — Discharge Instructions (Signed)
Substance Abuse Treatment Programs ° °Intensive Outpatient Programs °[facility]     °[address]      °[place], [place]                   °[phone]      ° °[facility] °[address] °[place], [place] °[phone] ° °[facility]     °(Inpatient and outpatient)     °[address]           °[phone]   ° °[facility] °[phone] (Suboxone and Methadone) ° °[address]      °[place], [place] [address]      °[phone]      ° °[address] °[place], [place] °[phone] ° °[facility] (Outpatient/Inpatient, Chemical)    °(insurance only) [phone]      °       °[facility] (Groups & Residential) °[place], [place] °[phone] ° °   °[facility]     °[address]     °[place], [place]      °[phone]      ° °[facility] (for caregivers and family) °[address] °[place], [place] °336-299-4655 ° ° ° ° ° °Residential Treatment Programs °[facility]      °[address], [place], [place] [address]  °[phone]      ° °[facility] °[address] [place], [place] [address] °[phone] ° °[facility]        °[phone]      ° °[facility] °[phone] ° °[facility]             °[address]                                         °[place], [place]                                                °[phone] or [phone]                              ° °[facility] [place] °[address] °[place], [address] °[phone] ° °[facility]    °[address]      °[place], [place]     °[phone]      ° °[facility] °[address] °[place], [place] °[phone] ° °[facility]   °[address]     °[place], [place] [address]     °[phone]      °Admissions: 8am-3pm M-F ° °[facility] °[address] °[place],  [place] °336-227-7417 ° °[facility]: Residential Program (90 Days)   °[place], [place]      °336-725-8389 or [phone]    ° °[facility] °[place], [place] °(Walk in Hours over the weekend or by referral) ° °[facility] °[address], [place],  [address] °[phone] ° °Crisis Mobile: [facility]:  1-877-626-1772 (for crisis response 24 hours a day) °[facility] Hotline:      1-800-256-2452 °Outpatient Psychiatry and Counseling ° °[facility]: Mobile Crisis   Management 24 hours:  1-877-626-1772 ° °[facility] sliding scale fee and walk in schedule: M-F 8am-12pm/1pm-3pm °[address]  °[place], [place] [address] °[phone] ° °[facility] °[address] °[place], [place] [address] °[phone] ° °[facility] (Formerly known [facility])- new patient walk-in appointments available Monday - Friday 8am -3pm.          °[address] °[place], [place] [address] °336-676-6840 or crisis line- 336-676-6905 ° °[facility] Intensive Outpatient Therapy Program °[address] °[place], [place] [address] °336-832-9804 ° °[facility]                  °Crisis Services      °[phone]      °[address]     °[place], [place] [address]                ° °[facility]   °[facility] °[phone] °[address] °[place], [place] [address] ° ° °[facility]          °[address]  °[place], [place] [address]       °[phone] ° °[facility] °[address] °[place], [place] [address] °336-292-1510 ° °[facility]    °[address]    °[place], [place] [address]     °[phone]      ° °Parish McKinney, MD     °[address]     °[place] [address]     °[phone]     °  °[facility] °[address] °[place] [address] ° °[facility]     °[address]     °[place], [place]      °336-542-2076      ° °[facility] °Shamsher Ahluwalia, MD °[address] °[place], [place] [address] °[phone] ° °[facility]     °[address]     °[place], [place] [address]     °336-274-1237      ° °[facility] °[address] °[place], [place] [address] °336-854-4450 °Resources for Temporary Residential Assistance/Crisis Centers ° °DAY CENTERS °[facility] °M-F 8am-3pm   °[address] [place], [place] [address]   336-332-0824 °Services include: laundry, barbering, support groups, case management, phone  & computer access, showers, AA/NA mtgs, mental health/substance abuse nurse, job skills class, disability information, VA assistance, spiritual classes, etc.  ° °HOMELESS SHELTERS ° °[facility]     °[facility]   °[address], [place]     °[phone]       °       °[facility] (women and children)       °[address] °[place], [place] [address] °336-275-0820 °[email or web address] for application and process °Application Required ° °[facility]   °[address]    °[place] [address]     °[phone]       °             °[facility] °[address] °[place],  [address] °[phone] °336-235-0363(schedule application appt.) °Application Required ° °[facility] (women only)    °[address]     °[place],  [address]     °336-884-1039      °  Intake starts 6pm daily °Need valid ID, SSC, & Police report °[facility] [place] °[address] °[place], [place] °336-881-5420 °Application Required ° °[facility] (men only)     °[address]      °[place], [place]     °[phone]      ° °[facility] °(Pregnant women only) °[address] °[place], [place] °336-275-0206 ° °[facility]      °[address]      °[place], [place] [address]     °[phone]      °       °[facility] °[address] °[place], [place] °336-723-1848 °90 day commitment/SA/Application process ° °[facility](men only)     °[address]     °[place], [place]     °[phone]       °Check-in at 7pm     °       °[facility] °[address] °[place], [place] [address] °336-248-6684 °Men/Women/Women and Children must be there by 7 pm ° °[facility] °[place],  °336-722-8721                ° °

## 2018-09-11 NOTE — ED Provider Notes (Signed)
Emergency Department Provider Note   I have reviewed the triage vital signs and the nursing notes.   HISTORY  Chief Complaint Tachycardia   HPI Glenda [name] is a 38 y.o. female with PMH of opiate abuse and newly started IVDA presents to the emergency department for evaluation of palpitations and lightheadedness.  Patient states that symptoms began last night and have been persistent.  No modifying factors.  She denies chest pain or shortness of breath.  No syncope.  Patient states that she has been using opiate by injection.  She last used 2 weeks ago.  She denies any stimulant use.  She states that she had been on Xanax for the past many years but 10 days ago ran out of her prescription and has not had a refill.  No seizures.  No shaking or confusion.  No fever.   Past Medical History:  Diagnosis Date  . Asthma   . Bacterial vaginosis   . Bell's palsy   . Bronchitis   . Depression   . Polysubstance abuse ([place])   . PTSD (post-traumatic stress disorder)   . 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 Seizures [facility])     Patient Active Problem List   Diagnosis Date Noted  . History of substance abuse ([place]) 03/26/2013  . Personal history of alcoholism ([place]) 03/26/2013  . Anxiety disorder 03/26/2013    Past Surgical History:  Procedure Laterality Date  . cyst removed     from forehead    Allergies Prednisone and Penicillins  Family History  Problem Relation Age of Onset  . Mental illness Mother   . Stroke Mother   . Mental illness Father   . Cancer Father   . Cancer Maternal Grandmother   . Cancer Maternal Grandfather   . Heart disease Maternal Grandfather   . Cancer Paternal Grandfather     Social History Social History   Tobacco Use  . Smoking status: Current Every Day Smoker    Packs/day: 1.00    Years: 14.00    Pack years: 14.00    Types: Cigarettes  . Smokeless tobacco: Never Used  Substance Use Topics   . Alcohol use: Yes    Alcohol/week: 1.0 standard drinks    Types: 1 Cans of beer per week    Comment: social  . Drug use: Yes    Types: Marijuana    Review of Systems  Constitutional: No fever/chills Eyes: No visual changes. ENT: No sore throat. Cardiovascular: Denies chest pain. Positive palpitations and lightheadedness.  Respiratory: Denies shortness of breath. Gastrointestinal: No abdominal pain.  No nausea, no vomiting.  No diarrhea.  No constipation. Genitourinary: Negative for dysuria. Musculoskeletal: Negative for back pain. Skin: Negative for rash. Neurological: Negative for headaches, focal weakness or numbness.  10-point ROS otherwise negative.  ____________________________________________   PHYSICAL EXAM:  VITAL SIGNS: ED Triage Vitals  Enc Vitals Group     BP 09/11/18 1120 132/89     Pulse Rate 09/11/18 1120 (!) 105     Resp 09/11/18 1120 20     Temp 09/11/18 1120 98.3 F (36.8 C)     Temp Source 09/11/18 1120 Oral     SpO2 09/11/18 1120 100 %     Weight 09/11/18 1114 140 lb (63.5 kg)     Height 09/11/18 1114 5\' 6"  (1.676 m)   Constitutional: Alert and oriented. Well appearing and in no acute distress. Eyes: Conjunctivae are normal.  Head: Atraumatic. Nose: No congestion/rhinnorhea. Mouth/Throat: Mucous membranes are moist.   Neck: No stridor.   Cardiovascular: Sinus tachycardia. Good peripheral circulation. Grossly normal heart sounds. No murmurs.  Respiratory: Normal respiratory effort.  No retractions. Lungs CTAB. Gastrointestinal: Soft and nontender. No distention.  Musculoskeletal: No lower extremity tenderness nor edema. No gross deformities of extremities. Neurologic:  Normal speech and language. No gross focal neurologic deficits are appreciated.  Skin:  Skin is warm, dry and intact. No rash noted. Bruising to the left AC. No abscess.  ____________________________________________   LABS (all labs ordered are listed, but only abnormal  results are displayed)  Labs Reviewed  COMPREHENSIVE METABOLIC PANEL - Abnormal; Notable for the following components:      Result Value   Potassium 2.9 (*)    Glucose, Bld 100 (*)    BUN 5 (*)    AST 124 (*)    ALT 95 (*)    All other components within normal limits  CBC WITH DIFFERENTIAL/PLATELET - Abnormal; Notable for the following components:   WBC 14.1 (*)    RBC 5.60 (*)    Hemoglobin 17.1 (*)    HCT 49.9 (*)    Neutro Abs 9.8 (*)    Abs Immature Granulocytes 0.10 (*)    All other components within normal limits  TSH  MAGNESIUM   ____________________________________________  EKG   EKG Interpretation  Date/Time:  Thursday September 11 2018 11:16:17 EDT Ventricular Rate:  103 PR Interval:    QRS Duration: 96 QT Interval:  343 QTC Calculation: 449 R Axis:   103 Text Interpretation:  Sinus tachycardia Ventricular premature complex Borderline right axis deviation Borderline repolarization abnormality Artifact in lead(s) V2 No STEMI  Confirmed by Alona BeneLong, Joshua [phone]([id number]) on 09/11/2018 11:25:49 AM       ____________________________________________  RADIOLOGY  Dg Chest 2 View  Result Date: 09/11/2018 CLINICAL DATA:  Palpitations EXAM: CHEST - 2 VIEW COMPARISON:  01/18/2017 FINDINGS: The heart size and mediastinal contours are within normal limits. The lung apices are excluded on frontal radiograph. The visualized skeletal structures are unremarkable. IMPRESSION: The lung apices are excluded on frontal radiograph. Within this limitation, no acute abnormality of the lungs. Electronically Signed   By: Lauralyn PrimesAlex  Bibbey M.D.   On: 09/11/2018 12:53    ____________________________________________   PROCEDURES  Procedure(s) performed:   Procedures  None ____________________________________________   INITIAL IMPRESSION / ASSESSMENT AND PLAN / ED COURSE  Pertinent labs & imaging results that were available during my care of the patient were reviewed by me and considered in my  medical decision making (see chart for details).   Patient presents to the emergency department for evaluation of heart palpitations with lightheadedness.  No fever or chest pain.  No shortness of breath.  No hypoxemia.  Patient does have sinus tachycardia here.  No murmurs.  Patient is overall well-appearing.  Has been off Xanax for the last 10 days. Question withdrawal but lower suspicion at day 10. No findings to suspect bacterial endocarditis. Lower PE suspicion. No URI/flu-like symptoms to suspect COVID.   03:00 PM  Patient symptoms improved with IVF. Continues to have HR elevated at times in the rooms but no symptoms. Plan for PO hydration and PCP follow up in the coming week. Patient is comfortable with the plan at discharge.  ____________________________________________  FINAL CLINICAL IMPRESSION(S) / ED DIAGNOSES  Final diagnoses:  Palpitations  Hypokalemia     MEDICATIONS GIVEN DURING THIS VISIT:  Medications  sodium chloride 0.9 % bolus  1,000 mL (0 mLs Intravenous Stopped 09/11/18 1515)  ALPRAZolam (XANAX) tablet 1 mg (1 mg Oral Given 09/11/18 1230)     NEW OUTPATIENT MEDICATIONS STARTED DURING THIS VISIT:  Discharge Medication List as of 09/11/2018  2:40 PM    START taking these medications   Details  hydrOXYzine (ATARAX/VISTARIL) 25 MG tablet Take 1 tablet (25 mg total) by mouth every 6 (six) hours as needed for anxiety or nausea., Starting Thu 09/11/2018, Print        Note:  This document was prepared using Dragon voice recognition software and may include unintentional dictation errors.  Alona Bene, MD Emergency Medicine    Long, Arlyss Repress, MD 09/11/18 [phone]

## 2018-10-21 ENCOUNTER — Encounter (HOSPITAL_COMMUNITY): Payer: Self-pay | Admitting: Emergency Medicine

## 2018-10-21 ENCOUNTER — Emergency Department (HOSPITAL_COMMUNITY)
Admission: EM | Admit: 2018-10-21 | Discharge: 2018-10-21 | Disposition: A | Discharge status: Home / Self Care | Payer: Self-pay | Attending: Emergency Medicine | Admitting: Emergency Medicine

## 2018-10-21 ENCOUNTER — Other Ambulatory Visit: Payer: Self-pay

## 2018-10-21 DIAGNOSIS — F17210 Nicotine dependence, cigarettes, uncomplicated: Secondary | ICD-10-CM | POA: Insufficient documentation

## 2018-10-21 DIAGNOSIS — J45909 Unspecified asthma, uncomplicated: Secondary | ICD-10-CM | POA: Insufficient documentation

## 2018-10-21 DIAGNOSIS — Y907 Blood alcohol level of 200-239 mg/100 ml: Secondary | ICD-10-CM | POA: Insufficient documentation

## 2018-10-21 DIAGNOSIS — F331 Major depressive disorder, recurrent, moderate: Secondary | ICD-10-CM | POA: Insufficient documentation

## 2018-10-21 DIAGNOSIS — F10920 Alcohol use, unspecified with intoxication, uncomplicated: Secondary | ICD-10-CM

## 2018-10-21 LAB — COMPREHENSIVE METABOLIC PANEL
ALT: 14 U/L (ref 0–44)
AST: 17 U/L (ref 15–41)
Albumin: 5.1 g/dL — ABNORMAL HIGH (ref 3.5–5.0)
Alkaline Phosphatase: 91 U/L (ref 38–126)
Anion gap: 11 (ref 5–15)
BUN: 9 mg/dL (ref 6–20)
CO2: 25 mmol/L (ref 22–32)
Calcium: 9.8 mg/dL (ref 8.9–10.3)
Chloride: 108 mmol/L (ref 98–111)
Creatinine, Ser: 0.86 mg/dL (ref 0.44–1.00)
GFR calc Af Amer: >60 mL/min (ref >60)
GFR calc non Af Amer: >60 mL/min (ref >60)
Glucose, Bld: 105 mg/dL — ABNORMAL HIGH (ref 70–99)
Potassium: 3.9 mmol/L (ref 3.5–5.1)
Sodium: 144 mmol/L (ref 135–145)
Total Bilirubin: 0.4 mg/dL (ref 0.3–1.2)
Total Protein: 8.7 g/dL — ABNORMAL HIGH (ref 6.5–8.1)

## 2018-10-21 LAB — CBC
HCT: 49.7 % — ABNORMAL HIGH (ref 36.0–46.0)
Hemoglobin: 16.3 g/dL — ABNORMAL HIGH (ref 12.0–15.0)
MCH: 30.5 pg (ref 26.0–34.0)
MCHC: 32.8 g/dL (ref 30.0–36.0)
MCV: 92.9 fL (ref 80.0–100.0)
Platelets: 382 10*3/uL (ref 150–400)
RBC: 5.35 MIL/uL — ABNORMAL HIGH (ref 3.87–5.11)
RDW: 13.2 % (ref 11.5–15.5)
WBC: 15 10*3/uL — ABNORMAL HIGH (ref 4.0–10.5)
nRBC: 0 % (ref 0.0–0.2)

## 2018-10-21 LAB — RAPID URINE DRUG SCREEN, HOSP PERFORMED
Amphetamines: NOT DETECTED
Barbiturates: NOT DETECTED
Benzodiazepines: POSITIVE — AB
Cocaine: NOT DETECTED
Opiates: NOT DETECTED
Tetrahydrocannabinol: POSITIVE — AB

## 2018-10-21 LAB — ETHANOL: Alcohol, Ethyl (B): 203 mg/dL — ABNORMAL HIGH (ref <10)

## 2018-10-21 LAB — SALICYLATE LEVEL: Salicylate Lvl: <7 mg/dL (ref 2.8–30.0)

## 2018-10-21 LAB — ACETAMINOPHEN LEVEL: Acetaminophen (Tylenol), Serum: <10 ug/mL — ABNORMAL LOW (ref 10–30)

## 2018-10-21 MED ORDER — VITAMIN B-1 100 MG PO TABS
100.0000 mg | ORAL_TABLET | Freq: Every day | ORAL | Status: DC
  Administered 2018-10-21: 100 mg via ORAL
  Filled 2018-10-21: qty 1

## 2018-10-21 MED ORDER — CHLORDIAZEPOXIDE HCL 25 MG PO CAPS: ORAL_CAPSULE | ORAL | 0 refills | Status: DC

## 2018-10-21 MED ORDER — LORAZEPAM 2 MG/ML IJ SOLN: 0.0000 mg | Freq: Two times a day (BID) | INTRAMUSCULAR | Status: DC

## 2018-10-21 MED ORDER — LORAZEPAM 2 MG/ML IJ SOLN: 0.0000 mg | Freq: Four times a day (QID) | INTRAMUSCULAR | Status: DC

## 2018-10-21 MED ORDER — THIAMINE HCL 100 MG/ML IJ SOLN: 100.0000 mg | Freq: Every day | INTRAMUSCULAR | Status: DC

## 2018-10-21 MED ORDER — NICOTINE 21 MG/24HR TD PT24
21.0000 mg | MEDICATED_PATCH | Freq: Once | TRANSDERMAL | Status: DC
  Administered 2018-10-21: 21 mg via TRANSDERMAL
  Filled 2018-10-21: qty 1

## 2018-10-21 MED ORDER — LORAZEPAM 1 MG PO TABS: 0.0000 mg | ORAL_TABLET | Freq: Four times a day (QID) | ORAL | Status: DC

## 2018-10-21 MED ORDER — LORAZEPAM 1 MG PO TABS: 0.0000 mg | ORAL_TABLET | Freq: Two times a day (BID) | ORAL | Status: DC

## 2018-10-21 NOTE — BH Assessment (Addendum)
Tele Assessment Note   Patient Name: Glenda [name] MRN: [id number] Referring Physician: Georgann Housekeeper, RN Location of Patient: [facility] Location of Provider: Behavioral Health TTS Department  Glenda [name] is a 38 y.o. female who was brought to [facility] via ambulance after they were alerted via telephone that she had intentionally taken potassium as an intentional overdose attempt. Pt denies that she attempted to o/d, though she she told EMS that she did. Pt also reports she does not know how EMS knew to come to her home to check on her, though, after talking with her boyfriend (with her verbal consent), clinician discovered pt was the one who called non-emergency 911 in an attempt to have them check her out after she took the potassium.  Pt stated she had been drinking out at a restaurant when she returned home and that 2 sheriff's deputies arrived, followed by an ambulance. Pt states she was given the option to go to the hospital via sheriff's car or via ambulance, so she chose to go via ambulance. Pt has been IVCed due to her self-report that she intentionally attempted to o/d. Pt acknowledges near-daily marijuana use and infrequent EtOH use; pt also has a hx of IV opioid use, according to notes dated 09/11/2018, though pt did not note this when asked about any other use of substances.  Pt denies SI, HI, AVH, NSSIB, access to guns/weapons, and engagement in the legal system. Pt apologized numerous times for "wasting" pt's time, stating there was no reason to be at the hospital, as she had no idea who would have called the ambulance to come to her home or who would have been concerned with her. Pt looked at her heart monitor and noted that her heart's rhythm was fine which meant that she wasn't in distress and hadn't done anything to cause harm to it.  Pt gave verbal consent for clinician to contact her boyfriend, Erskine Emery, for collateral information. The information gathered can be found  in the assessment above.  Pt is oriented x4. Her recent and remote memory is intact. Pt was cooperative, though in denial, throughout the assessment. [name] insight, judgement, and impulse control is impaired at this time.    Diagnosis: F33.1, Major depressive disorder, Recurrent episode, Moderate   Past Medical History:  Past Medical History:  Diagnosis Date  . Asthma   . Bacterial vaginosis   . Bell's palsy   . Bronchitis   . Depression   . Polysubstance abuse ([facility])   . PTSD (post-traumatic stress disorder)   . Seizures ([facility])    vs pseudoseizures ("treated with xanax for the past 11 years")  . Seizures ([facility])     Past Surgical History:  Procedure Laterality Date  . cyst removed     from forehead    Family History:  Family History  Problem Relation Age of Onset  . Mental illness Mother   . Stroke Mother   . Mental illness Father   . Cancer Father   . Cancer Maternal Grandmother   . Cancer Maternal Grandfather   . Heart disease Maternal Grandfather   . Cancer Paternal Grandfather     Social History:  reports that she has been smoking cigarettes. She has a 14.00 pack-year smoking history. She has never used smokeless tobacco. She reports current alcohol use of about 1.0 standard drinks of alcohol per week. She reports current drug use. Drug: Marijuana.  Additional Social History:  Alcohol / Drug Use Pain Medications: Please see  MAR Prescriptions: Please see MAR Over the Counter: Please see MAR History of alcohol / drug use?: Yes Longest period of sobriety (when/how long): Unknown Substance #1 Name of Substance 1: EtOH 1 - Age of First Use: Unknown 1 - Amount (size/oz): 6-9 liquor beverages 1 - Frequency: 1x every six months 1 - Duration: Unknown 1 - Last Use / Amount: 10/20/2018 Substance #2 Name of Substance 2: Marijuana 2 - Age of First Use: Unknown 2 - Amount (size/oz): Several joints 2 - Frequency: Daily (if possible) 2 - Duration: Unknown 2 - Last Use  / Amount: 10/20/2018  CIWA: CIWA-Ar BP: (!) 121/92 Pulse Rate: (!) 59 COWS:    Allergies:  Allergies  Allergen Reactions  . Prednisone Other (See Comments)    Makes patient feel "crazy/dizziness"   . Penicillins Palpitations and Other (See Comments)    Mother has this allergy (heart rate "skyrockets"- patient has not yet been exposed to it) Did it involve swelling of the face/tongue/throat, SOB, or low BP? No Did it involve sudden or severe rash/hives, skin peeling, or any reaction on the inside of your mouth or nose? No Did you need to seek medical attention at a hospital or doctor's office? No When did it last happen? Mother is allergic If all above answers are "NO", may proceed with cephalosporin use.     Home Medications: (Not in a hospital admission)   OB/GYN Status:  Patient's last menstrual period was 10/16/2018.  General Assessment Data Location of Assessment: [facility] TTS Assessment: In system Is this a Tele or Face-to-Face Assessment?: Tele Assessment Is this an Initial Assessment or a Re-assessment for this encounter?: Initial Assessment Patient Accompanied by:: N/A Language Other than English: No Living Arrangements: Other (Comment)(Pt lives with her father and her boyfriend in a home) What gender do you identify as?: Female Marital status: Single Maiden name: Brewbaker Pregnancy Status: No Living Arrangements: Children, Spouse/significant other Can pt return to current living arrangement?: Yes Admission Status: Involuntary Petitioner: ED Attending Is patient capable of signing voluntary admission?: Yes Referral Source: Self/Family/Friend Insurance type: None     Crisis Care Plan Living Arrangements: Children, Spouse/significant other Legal Guardian: Other:(Self) Name of Psychiatrist: None Name of Therapist: None  Education Status Is patient currently in school?: No Is the patient employed, unemployed or receiving disability?: Unemployed  Risk to self  with the past 6 months Suicidal Ideation: Yes-Currently Present(Denies but admitted it when the ambulance initially arrived) Has patient been a risk to self within the past 6 months prior to admission? : No Suicidal Intent: Yes-Currently Present(Denies but admitted it when the ambulance initially arrived) Has patient had any suicidal intent within the past 6 months prior to admission? : No Is patient at risk for suicide?: Yes(Denies but admitted it when the ambulance initially arrived) Suicidal Plan?: Yes-Currently Present(Denies but attempted to o/d on potassium) Has patient had any suicidal plan within the past 6 months prior to admission? : No Specify Current Suicidal Plan: Pt attempted to o/d on potassium Access to Means: Yes Specify Access to Suicidal Means: Pt has access to potassium pills What has been your use of drugs/alcohol within the last 12 months?: Pt acknowledges EtOH and marijuana use Previous Attempts/Gestures: Yes How many times?: 5 Other Self Harm Risks: Pt is stressed with her life situation Triggers for Past Attempts: Unknown Intentional Self Injurious Behavior: None Family Suicide History: No Recent stressful life event(s): Other (Comment)(Pt has moved in w/ her father and her boyfriend) Persecutory voices/beliefs?: No Depression:  Yes Depression Symptoms: Isolating, Fatigue, Loss of interest in usual pleasures, Feeling worthless/self pity Substance abuse history and/or treatment for substance abuse?: Yes Suicide prevention information given to non-admitted patients: Not applicable  Risk to Others within the past 6 months Homicidal Ideation: No Does patient have any lifetime risk of violence toward others beyond the six months prior to admission? : No Thoughts of Harm to Others: No Current Homicidal Intent: No Current Homicidal Plan: No Access to Homicidal Means: No Identified Victim: None noted History of harm to others?: No Assessment of Violence: None  Noted Violent Behavior Description: None noted Does patient have access to weapons?: No(Pt denies access to guns/weapons) Criminal Charges Pending?: No Does patient have a court date: No Is patient on probation?: No  Psychosis Hallucinations: None noted Delusions: None noted  Mental Status Report Appearance/Hygiene: In scrubs Eye Contact: Good Motor Activity: Freedom of movement, Other (Comment)(Pt is sitting on her hospital bed) Speech: Logical/coherent Level of Consciousness: Alert Mood: Anxious Affect: Anxious, Appropriate to circumstance Anxiety Level: Minimal Thought Processes: Coherent, Relevant Judgement: Impaired Orientation: Person, Place, Time, Situation Obsessive Compulsive Thoughts/Behaviors: Minimal  Cognitive Functioning Concentration: Good Memory: Recent Intact, Remote Intact Is patient IDD: No Insight: Fair Impulse Control: Poor Appetite: Good Have you had any weight changes? : No Change Sleep: No Change Total Hours of Sleep: 8 Vegetative Symptoms: None  ADLScreening [facility] Assessment Services) Patient's cognitive ability adequate to safely complete daily activities?: Yes Patient able to express need for assistance with ADLs?: Yes Independently performs ADLs?: Yes (appropriate for developmental age)  Prior Inpatient Therapy Prior Inpatient Therapy: Yes Prior Therapy Dates: Multiple when pt was in her 63s Prior Therapy Facilty/Provider(s): Pt does not remember Reason for Treatment: SI, pt was mentally unstable ("looney toons")  Prior Outpatient Therapy Prior Outpatient Therapy: Yes Prior Therapy Dates: Multiple Prior Therapy Facilty/Provider(s): Pt does not remember Reason for Treatment: SI, pt was mentally unstable ("looney tunes") Does patient have an ACCT team?: No Does patient have Intensive In-House Services?  : No Does patient have [facility] services? : No Does patient have P4CC services?: No  ADL Screening (condition at time of  admission) Patient's cognitive ability adequate to safely complete daily activities?: Yes Is the patient deaf or have difficulty hearing?: No Does the patient have difficulty seeing, even when wearing glasses/contacts?: No Does the patient have difficulty concentrating, remembering, or making decisions?: No Patient able to express need for assistance with ADLs?: Yes Does the patient have difficulty dressing or bathing?: No Independently performs ADLs?: Yes (appropriate for developmental age) Does the patient have difficulty walking or climbing stairs?: No Weakness of Legs: None Weakness of Arms/Hands: None  Home Assistive Devices/Equipment Home Assistive Devices/Equipment: None  Therapy Consults (therapy consults require a physician order) PT Evaluation Needed: No OT Evalulation Needed: No SLP Evaluation Needed: No Abuse/Neglect Assessment (Assessment to be complete while patient is alone) Abuse/Neglect Assessment Can Be Completed: Yes Physical Abuse: Denies Verbal Abuse: Denies Sexual Abuse: Denies Exploitation of patient/patient's resources: Denies Self-Neglect: Denies Values / Beliefs Cultural Requests During Hospitalization: None Spiritual Requests During Hospitalization: None Consults Spiritual Care Consult Needed: No Social Work Consult Needed: No Merchant navy officer (For Healthcare) Does Patient Have a Medical Advance Directive?: No Would patient like information on creating a medical advance directive?: No - Patient declined       Disposition: Nira Conn, NP, reviewed pt's chart and information and determined pt should be observed until later today and re-assessed by psychiatry. This information was provided to pt's nurse,  Casimiro NeedleMichael RN, at [phone].   Disposition Initial Assessment Completed for this Encounter: Yes Patient referred to: Other (Comment)(Pt will be observed to ensure safety and stability)  This service was provided via telemedicine using a 2-way,  interactive audio and video technology.  Names of all persons participating in this telemedicine service and their role in this encounter. Name: Glenda [name] Role: Patient  Name: Erskine EmeryMichael Collins Role: Patient's Boyfriend  Name: Nira ConnJason Berry Role: Nurse Practitioner  Name: Duard BradySamantha Jeilyn Reznik Role: Clinician    Ralph DowdySamantha L Itamar Mcgowan 10/21/2018 6:05 AM

## 2018-10-21 NOTE — ED Notes (Signed)
IVC paper work faxed @ 0200 to [phone]

## 2018-10-21 NOTE — ED Notes (Signed)
Pt being assessed by [facility]. Will complete CIWA score when assessment is over.

## 2018-10-21 NOTE — ED Provider Notes (Signed)
Psych team has re-evaluated pt: states pt denies SI/SA/HI, no psychosis, IVC can be rescinded as pt does not meet inpt criteria and can be discharged home with outpt f/u. Will d/c stable.    Francine Graven, DO 10/21/18 1135

## 2018-10-21 NOTE — ED Notes (Signed)
Given meal tray.

## 2018-10-21 NOTE — Discharge Instructions (Addendum)
Substance Abuse Treatment Programs ° °Intensive Outpatient Programs °[facility]     °[address]      °[place], [place]                   °[phone]      ° °[facility] °[address] °[place], [place] °[phone] ° °[facility]     °(Inpatient and outpatient)     °[address]           °[phone]   ° °[facility] °[phone] (Suboxone and Methadone) ° °[address]      °[place], [place] [address]      °[phone]      ° °[address] °[place], [place] °852-3033 ° °[facility] (Outpatient/Inpatient, Chemical)    °(insurance only) [phone]      °       °[facility] (Groups & Residential) °[place], [place] °[phone] ° °   °[facility]     °[address]     °[place], [place]      °[phone]      ° °[facility] (for caregivers and family) °[address] °[place], [place] °[phone] ° ° ° ° ° °Residential Treatment Programs °[facility]      °[address], [place], [place] [address]  °([phone]      ° °[facility] °[address] [place], [place] [address] °[phone] ° °[facility]        °[phone]      ° °[facility] °[phone] ° °[facility]             °[address]                                         °[place], [place]                                                °[phone] or [phone]                              ° °[facility] [place] °[address] °[place], [address] °[phone] ° °[facility]    °[address]      °, [place]     °[phone]      ° °[facility] °[address] °, [place] °[phone] ° °[facility]   °[address]     °[place], [place] [address]     °[phone]      °Admissions: 8am-3pm M-F ° °[facility] °[address] °[place],  [place] °[phone] ° °[facility]: Residential Program (90 Days)   °[place], [place]      °[phone] or [phone]    ° °[facility] °[place], [place] °(Walk in Hours over the weekend or by referral) ° °[facility] °[address], [place], [place] [address] °([phone]) [phone] ° °Crisis Mobile: [facility]:  1-877-626-1772 (for crisis response 24 hours a day) °[facility] Hotline:      1-800-256-2452 °Outpatient Psychiatry and Counseling ° °[facility]: Mobile Crisis   Management 24 hours:  1-579-867-5796  [facility] of the [employer] sliding scale fee and walk in schedule: M-F 8am-12pm/1pm-3pm [address]  [place], [place] [address] [place], [place] [address] [phone]  [facility] ([facility])- new patient walk-in appointments available Monday - Friday 8am -3pm.          [address] [place], [place] [address] [phone] or crisis line- [facility]/ Intensive Outpatient Therapy Program [place], [place] [address] [place]      [phone] [place], [place] [address]                 [place]   [facility] [phone] [place], [place] [address]   [facility]          [address] Johnette Abraham  [place], [place] [address]       [phone]  [facility] [address] [place], [place] [address] [phone]  [facility]    [address] [place], [place] [address]     [place], [place] Joycelyn Man     [place] [address]     [phone]       [facility] [place] [address]  [facility]     [address], [place], [place], [place] [address] [place]     [address]     [place], [place] [address]     [phone]       [facility] [address] [place], [place] [address] [phone] Resources for Temporary Residential Assistance/Crisis [facility]) M-F 8am-3pm   [address] [place], [place] [address]   [phone] Services include: laundry, barbering, support groups, case management, phone  & computer access, showers, AA/NA mtgs, mental health/substance abuse nurse, job skills class, disability information, VA assistance, spiritual classes, etc.   [facility]   [address], [place]     [place]              [employer] (women and children)       [place]. [place], [place] [address] [phone] [email or web address] for application and process Application Required  [facility]   [address]    [place] [address]     [phone]                    [place],  [address] [phone](OILNZVJK application appt.) Application Required  [facility] (women only)    [address]     [place],  [address]     [phone]  Intake starts 6pm daily Need valid ID, SSC, & Police report [employer] [address] [place], [place] [phone] Application Required  [employer] (men only)     [place].      [place], [place]       [facility] (Pregnant women only) [address] [place], [place]  [facility]      [address]      [place], [place] [address]     [phone]             [facility] [address] [place], [place] 90 day commitment/SA/Application process  [facility](men only)     [address]     [place], [place]       Check-in at [facility] [address] [place], [place] [address] [phone] Men/Women/Women and Children must be there by 7 pm  [place], [place]                  Take the prescription as directed.  Call your regular medical doctor today to schedule a follow up appointment within the week.  Return to the Emergency Department immediately sooner if worsening.

## 2018-10-21 NOTE — ED Triage Notes (Addendum)
Pt states "EMS rolled up at my house and told me I needed to go to the hospital." Pt denies SI/HI. Pt states she had "some drinks at the bar tonight." Pt also stating she had "a choice between the police car or the ambulance so, shit, I chose the ambulance." Ems stating that she admitted to taking potassium with intentions of overdosing.

## 2018-10-21 NOTE — Progress Notes (Signed)
Patient ID: Glenda [name], female   DOB: 02-Apr-1980, 38 y.o.   MRN: [id number]   Reassessment  Per chart review; Patient was brought to the emergency department by EMS after apparent overdose.  He is not clear who called EMS but patient told the paramedic that she took an overdose of potassium and that she wanted to kill herself.  She admits to drinking alcohol tonight.  At arrival she is now denying suicidal ideation  During this evaluation, patient is alert and oriented x4, calm and cooperative. She is still intoxicated although reports feeling less intoxicated compeed to her arrival to the ED last night. She reports yesterday, she went out to a bar and drank 4 margaritas. Reports she is unsure how she got home and reports she is not aware of what led up to her hospitalization. Reports she does not drink alcohol frequently. Reports that she only recall EMT coming to her home and taking her to the hospital. She denies taking any potassium yesterday. She denies that she was trying to kill herself and further denies SI at this time. She denies AVH or other psychosis. She denies feeling depressed and reports she feels anxious because she does not know how long she wilL be in the hospital. She states, " Ill admit, I was very intoxicated. I may have said something like when I take my potassium it makes me feel crazy but I don't remember saying I wanted to kill myself because I don't."   With verbal consent I spoke with patients boyfriend Glenda [name] [phone]. He reports that he is unclear as to what happened last night. He reports patient went out to a bar at 6:30 pm and he went to pick her up at 9:30 pm. He reports he had never seen patient this intoxicated as she seldom drink. Reports he went to take the dog out and when he came back, patient asked for the non-emergency number. Reports EMT showed up as well as the sheriff. States, " I am not sure what she said to them for them to come. I do know she  take potassium pills but I did not see her taking any yesterday." He states, " It is kind of hard to believe that she tried to overdose because she has never tried to harm herself or never mentioned anything about wanting to harm herself." He goes on to say that he does not believe that patient is a threat or danger to herself or others. He does state that lately, patient has been overwhelmed with taking care of her father for the last 1.5 months who is severely ill. He states, " that is probably the only thing I feel is overwhelming her."  He states that there are guns in the home  (but in a locked box) and when patient does drink al;cohol, she has seizures.   I discussed case with team. Because patient is denying any suicidal thoughts, homicidal thoughts, or psychosis, along with collateral information, per Dr. Dwyane Dee she can psychiatrically cleared. Due to seizure activity following alcohol use, Dr. Dwyane Dee has recommended patient being discharged on a librium taper for three days.   Safety was discussed as follow;   1. Monitor for suicidal ideation. 2. The patient should abstain from all illicit substances and alcohol. 3.  If the patient's symptoms worsen or do not continue to improve or if the patient becomes actively suicidal or homicidal then it is recommended that the patient return to the closest hospital emergency  room or call 911 for further evaluation and treatment.  National Suicide Prevention Lifeline 1800-SUICIDE or 743 495 7078. 4. Education provided about removing/locking any firearms, medications or dangerous products from the home.  EDP updated on current disposition and recommendation.

## 2018-10-21 NOTE — ED Provider Notes (Signed)
[facility] Provider Note   CSN: [id number] Arrival date & time: 10/21/18  0053     History   Chief Complaint Chief Complaint  Patient presents with  . Alcohol Intoxication    HPI [name] is a 38 y.o. female.     Patient brought to the emergency department by EMS after apparent overdose.  He is not clear who called EMS but patient told the paramedic that she took an overdose of potassium and that she wanted to kill herself.  She admits to drinking alcohol tonight.  At arrival she is now denying suicidal ideation.     Past Medical History:  Diagnosis Date  . Asthma   . Bacterial vaginosis   . Bell's palsy   . Bronchitis   . Depression   . Polysubstance abuse (HCC)   . PTSD (post-traumatic stress disorder)   . Seizures (HCC)    vs pseudoseizures ("treated with xanax for the past 11 years")  . Seizures [facility](HCC)     Patient Active Problem List   Diagnosis Date Noted  . History of substance abuse (HCC) 03/26/2013  . Personal history of alcoholism (HCC) 03/26/2013  . Anxiety disorder 03/26/2013    Past Surgical History:  Procedure Laterality Date  . cyst removed     from forehead     OB History   No obstetric history on file.      Home Medications    Prior to Admission medications   Medication Sig Start Date End Date Taking? Authorizing Provider  ALPRAZolam Prudy Feeler(XANAX) 1 MG tablet Take 1 tablet (1 mg total) by mouth 2 (two) times daily as needed. Patient taking differently: Take 1 mg by mouth 2 (two) times daily as needed for anxiety.  02/06/17   Burgess AmorIdol, Julie, PA-C  hydrOXYzine (ATARAX/VISTARIL) 25 MG tablet Take 1 tablet (25 mg total) by mouth every 6 (six) hours as needed for anxiety or nausea. 09/11/18   Long, Arlyss RepressJoshua G, MD  potassium chloride SA (K-DUR) 20 MEQ tablet Take 2 tablets (40 mEq total) by mouth daily for 5 days. 09/11/18 09/16/18  Long, Arlyss RepressJoshua G, MD    Family History Family History  Problem Relation Age of Onset  . Mental  illness Mother   . Stroke Mother   . Mental illness Father   . Cancer Father   . Cancer Maternal Grandmother   . Cancer Maternal Grandfather   . Heart disease Maternal Grandfather   . Cancer Paternal Grandfather     Social History Social History   Tobacco Use  . Smoking status: Current Every Day Smoker    Packs/day: 1.00    Years: 14.00    Pack years: 14.00    Types: Cigarettes  . Smokeless tobacco: Never Used  Substance Use Topics  . Alcohol use: Yes    Alcohol/week: 1.0 standard drinks    Types: 1 Cans of beer per week    Comment: social  . Drug use: Yes    Types: Marijuana     Allergies   Prednisone and Penicillins   Review of Systems Review of Systems  Psychiatric/Behavioral: Positive for suicidal ideas.  All other systems reviewed and are negative.    Physical Exam Updated Vital Signs BP (!) 121/92 (BP Location: Left Arm)   Pulse (!) 59   Temp (!) 97.5 F (36.4 C) (Oral)   Resp 16   Wt 58.8 kg   LMP 10/16/2018   SpO2 100%   BMI 20.93 kg/m   Physical Exam  Vitals signs and nursing note reviewed.  Constitutional:      General: She is not in acute distress.    Appearance: Normal appearance. She is well-developed.  HENT:     Head: Normocephalic and atraumatic.     Right Ear: Hearing normal.     Left Ear: Hearing normal.     Nose: Nose normal.  Eyes:     Conjunctiva/sclera: Conjunctivae normal.     Pupils: Pupils are equal, round, and reactive to light.  Neck:     Musculoskeletal: Normal range of motion and neck supple.  Cardiovascular:     Rate and Rhythm: Regular rhythm.     Heart sounds: S1 normal and S2 normal. No murmur. No friction rub. No gallop.   Pulmonary:     Effort: Pulmonary effort is normal. No respiratory distress.     Breath sounds: Normal breath sounds.  Chest:     Chest wall: No tenderness.  Abdominal:     General: Bowel sounds are normal.     Palpations: Abdomen is soft.     Tenderness: There is no abdominal tenderness.  There is no guarding or rebound. Negative signs include Murphy's sign and McBurney's sign.     Hernia: No hernia is present.  Musculoskeletal: Normal range of motion.  Skin:    General: Skin is warm and dry.     Findings: No rash.  Neurological:     Mental Status: She is alert and oriented to person, place, and time.     GCS: GCS eye subscore is 4. GCS verbal subscore is 5. GCS motor subscore is 6.     Cranial Nerves: No cranial nerve deficit.     Sensory: No sensory deficit.     Coordination: Coordination normal.  Psychiatric:        Attention and Perception: Attention normal.        Mood and Affect: Mood normal.        Speech: Speech is slurred.      ED Treatments / Results  Labs (all labs ordered are listed, but only abnormal results are displayed) Labs Reviewed  CBC  COMPREHENSIVE METABOLIC PANEL  RAPID URINE DRUG SCREEN, HOSP PERFORMED  ETHANOL  ACETAMINOPHEN LEVEL  SALICYLATE LEVEL  I-STAT BETA HCG BLOOD, ED ([facility], [facility], [facility] ONLY)    EKG EKG Interpretation  Date/Time:  Tuesday October 21 2018 01:04:45 EDT Ventricular Rate:  57 PR Interval:    QRS Duration: 99 QT Interval:  420 QTC Calculation: 409 R Axis:   89 Text Interpretation:  Sinus arrhythmia Otherwise within normal limits Confirmed by Gilda Crease [phone]) on 10/21/2018 1:11:50 AM   Radiology No results found.  Procedures Procedures (including critical care time)  Medications Ordered in ED Medications - No data to display   Initial Impression / Assessment and Plan / ED Course  I have reviewed the triage vital signs and the nursing notes.  Pertinent labs & imaging results that were available during my care of the patient were reviewed by me and considered in my medical decision making (see chart for details).        Patient brought to the emergency department under unclear circumstances.  She clearly is intoxicated.  She told EMS directly that she was suicidal and that she took an  intentional overdose of potassium as a suicide attempt tonight.  At arrival to the ER, however, she has recanted this statement.  She seems to be resistant to evaluation and treatment at this time.  In  order to keep her safe, will initiate IVC paperwork.  Final Clinical Impressions(s) / ED Diagnoses   Final diagnoses:  Alcoholic intoxication without complication [facility])  Suicidal ideation    ED Discharge Orders    None       Betsey Holiday Gwenyth Allegra, MD 10/21/18 [phone]

## 2019-04-11 ENCOUNTER — Ambulatory Visit
Admission: EM | Admit: 2019-04-11 | Discharge: 2019-04-11 | Disposition: A | Discharge status: Home / Self Care | Payer: Self-pay

## 2019-04-11 NOTE — ED Notes (Signed)
Glenda Rummage, NP spoke with patient at front desk.  Patient describing seizure like activity and patient relating this to not having xanax in 3 days.  Patient has family member waiting outside

## 2019-04-12 ENCOUNTER — Other Ambulatory Visit: Payer: Self-pay

## 2019-04-12 ENCOUNTER — Encounter (HOSPITAL_COMMUNITY): Payer: Self-pay | Admitting: *Deleted

## 2019-04-12 ENCOUNTER — Emergency Department (HOSPITAL_COMMUNITY)
Admission: EM | Admit: 2019-04-12 | Discharge: 2019-04-12 | Disposition: A | Discharge status: Home / Self Care | Payer: Self-pay | Attending: Emergency Medicine | Admitting: Emergency Medicine

## 2019-04-12 DIAGNOSIS — F1320 Sedative, hypnotic or anxiolytic dependence, uncomplicated: Secondary | ICD-10-CM | POA: Insufficient documentation

## 2019-04-12 DIAGNOSIS — F17210 Nicotine dependence, cigarettes, uncomplicated: Secondary | ICD-10-CM | POA: Insufficient documentation

## 2019-04-12 DIAGNOSIS — R569 Unspecified convulsions: Secondary | ICD-10-CM | POA: Insufficient documentation

## 2019-04-12 DIAGNOSIS — Z79899 Other long term (current) drug therapy: Secondary | ICD-10-CM | POA: Insufficient documentation

## 2019-04-12 LAB — BASIC METABOLIC PANEL
Anion gap: 9 (ref 5–15)
BUN: 6 mg/dL (ref 6–20)
CO2: 22 mmol/L (ref 22–32)
Calcium: 9.6 mg/dL (ref 8.9–10.3)
Chloride: 109 mmol/L (ref 98–111)
Creatinine, Ser: 0.88 mg/dL (ref 0.44–1.00)
GFR calc Af Amer: >60 mL/min (ref >60)
GFR calc non Af Amer: >60 mL/min (ref >60)
Glucose, Bld: 110 mg/dL — ABNORMAL HIGH (ref 70–99)
Potassium: 3.6 mmol/L (ref 3.5–5.1)
Sodium: 140 mmol/L (ref 135–145)

## 2019-04-12 LAB — CBC WITH DIFFERENTIAL/PLATELET
Abs Immature Granulocytes: 0.09 10*3/uL — ABNORMAL HIGH (ref 0.00–0.07)
Basophils Absolute: 0.1 10*3/uL (ref 0.0–0.1)
Basophils Relative: 1 %
Eosinophils Absolute: 0 10*3/uL (ref 0.0–0.5)
Eosinophils Relative: 0 %
HCT: 44.7 % (ref 36.0–46.0)
Hemoglobin: 15 g/dL (ref 12.0–15.0)
Immature Granulocytes: 1 %
Lymphocytes Relative: 22 %
Lymphs Abs: 2.6 10*3/uL (ref 0.7–4.0)
MCH: 31.8 pg (ref 26.0–34.0)
MCHC: 33.6 g/dL (ref 30.0–36.0)
MCV: 94.7 fL (ref 80.0–100.0)
Monocytes Absolute: 1 10*3/uL (ref 0.1–1.0)
Monocytes Relative: 8 %
Neutro Abs: 8.4 10*3/uL — ABNORMAL HIGH (ref 1.7–7.7)
Neutrophils Relative %: 68 %
Platelets: 313 10*3/uL (ref 150–400)
RBC: 4.72 MIL/uL (ref 3.87–5.11)
RDW: 12.5 % (ref 11.5–15.5)
WBC: 12.2 10*3/uL — ABNORMAL HIGH (ref 4.0–10.5)
nRBC: 0 % (ref 0.0–0.2)

## 2019-04-12 LAB — RAPID URINE DRUG SCREEN, HOSP PERFORMED
Amphetamines: NOT DETECTED
Barbiturates: NOT DETECTED
Benzodiazepines: POSITIVE — AB
Cocaine: NOT DETECTED
Opiates: NOT DETECTED
Tetrahydrocannabinol: POSITIVE — AB

## 2019-04-12 LAB — ETHANOL: Alcohol, Ethyl (B): <10 mg/dL (ref <10)

## 2019-04-12 LAB — POC URINE PREG, ED: Preg Test, Ur: NEGATIVE

## 2019-04-12 LAB — CBG MONITORING, ED: Glucose-Capillary: 127 mg/dL — ABNORMAL HIGH (ref 70–99)

## 2019-04-12 MED ORDER — LORAZEPAM 1 MG PO TABS
2.0000 mg | ORAL_TABLET | Freq: Once | ORAL | Status: AC
  Administered 2019-04-12: 2 mg via ORAL
  Filled 2019-04-12: qty 4

## 2019-04-12 MED ORDER — CHLORDIAZEPOXIDE HCL 25 MG PO CAPS: ORAL_CAPSULE | ORAL | 0 refills | Status: AC

## 2019-04-12 MED ORDER — LORAZEPAM 2 MG/ML IJ SOLN
1.0000 mg | Freq: Once | INTRAMUSCULAR | Status: DC
  Filled 2019-04-12: qty 1

## 2019-04-12 NOTE — ED Triage Notes (Signed)
Patient presents to the ED via [facility] due to a witnessed seizure lasting approximately 2 minutes.  Patient states she has been on 2mg  xanax every day for 20 years until 4 days ago.  Patient does not have a PCP.

## 2019-04-12 NOTE — ED Provider Notes (Signed)
[facility] Provider Note   CSN: [id number] Arrival date & time: 04/12/19  1010     History Chief Complaint  Patient presents with  . Seizures    Glenda [name] is a 39 y.o. female with a history of reported seizures (treated with xanax), polysubstance abuse including etoh (denies recent etoh use) presenting with several seizures witnessed by boyfriend, last occurring just prior to arrival.  Pt reports does not remember the events, but described as all over shaking.  Boyfriend not currently present to obtain additional information.  She endorses has been out of her xanax for 4 days now, normally takes 2 mg twice daily.  She moved temporarily to the area to care for his father who died last month, and plans to move back to [facility] soon.  In the interim, she is requesting assistance with her seizures and need for xanax.  Review of the [place] controlled substance database reveals her last prescribed xanax was 1 mg bid, prescribed January 2020.  When given pt this information, she states she has been getting it off the street.  She denies any other substance abuse.  She denies pain or injury sustained from seizure, denies tongue or mouth injury.  She does endorse a mild headache.  Also reports reduced PO intake x 3 days secondary to generally not feeling well. She denies cp, sob, abdominal pain, no n/v/d.    The history is provided by the patient.       Past Medical History:  Diagnosis Date  . Asthma   . Bacterial vaginosis   . Bell's palsy   . Bronchitis   . Depression   . Polysubstance abuse ([place])   . PTSD (post-traumatic stress disorder)   . Seizures ([place])    vs pseudoseizures ("treated with xanax for the past 11 years")  . Seizures [facility])     Patient Active Problem List   Diagnosis Date Noted  . History of substance abuse ([place]) 03/26/2013  . Personal history of alcoholism ([place]) 03/26/2013  . Anxiety disorder 03/26/2013    Past Surgical History:  Procedure  Laterality Date  . cyst removed     from forehead     OB History   No obstetric history on file.     Family History  Problem Relation Age of Onset  . Mental illness Mother   . Stroke Mother   . Mental illness Father   . Cancer Father   . Cancer Maternal Grandmother   . Cancer Maternal Grandfather   . Heart disease Maternal Grandfather   . Cancer Paternal Grandfather     Social History   Tobacco Use  . Smoking status: Current Every Day Smoker    Packs/day: 1.00    Years: 14.00    Pack years: 14.00    Types: Cigarettes  . Smokeless tobacco: Never Used  Substance Use Topics  . Alcohol use: Yes    Alcohol/week: 1.0 standard drinks    Types: 1 Cans of beer per week    Comment: social  . Drug use: Yes    Types: Marijuana    Home Medications Prior to Admission medications   Medication Sig Start Date End Date Taking? Authorizing Provider  ALPRAZolam Duanne Moron) 1 MG tablet Take 1 tablet (1 mg total) by mouth 2 (two) times daily as needed. Patient not taking: Reported on 10/21/2018 02/06/17   Evalee Jefferson, PA-C  chlordiazePOXIDE (LIBRIUM) 25 MG capsule 50mg  PO TID x 1D, then 25-50mg  PO BID X 1D, then  25-50mg  PO QD X 1D 04/12/19   Burgess Amor, PA-C  hydrOXYzine (ATARAX/VISTARIL) 25 MG tablet Take 1 tablet (25 mg total) by mouth every 6 (six) hours as needed for anxiety or nausea. 09/11/18   Long, Arlyss Repress, MD  potassium chloride SA (K-DUR) 20 MEQ tablet Take 2 tablets (40 mEq total) by mouth daily for 5 days. 09/11/18 09/16/18  LongArlyss Repress, MD    Allergies    Prednisone and Penicillins  Review of Systems   Review of Systems  Constitutional: Positive for appetite change. Negative for chills and fever.  HENT: Negative.   Eyes: Negative.   Respiratory: Negative for chest tightness and shortness of breath.   Cardiovascular: Negative for chest pain.  Gastrointestinal: Negative for abdominal pain, nausea and vomiting.  Genitourinary: Negative.   Musculoskeletal: Negative for  arthralgias, joint swelling and neck pain.  Skin: Negative.  Negative for rash and wound.  Neurological: Positive for tremors, seizures and headaches. Negative for dizziness, weakness, light-headedness and numbness.  Psychiatric/Behavioral: The patient is nervous/anxious.     Physical Exam Updated Vital Signs BP (!) 155/87   Pulse 97   Temp 98.1 F (36.7 C) (Oral)   Resp (!) 24   Ht 5\' 6"  (1.676 m)   Wt 61.2 kg   LMP 03/25/2019   SpO2 100%   BMI 21.79 kg/m   Physical Exam Vitals and nursing note reviewed.  Constitutional:      General: She is awake.     Appearance: Normal appearance. She is well-developed.     Comments: Awake and anxious, does not appear postictal.  HENT:     Head: Normocephalic and atraumatic.  Eyes:     Conjunctiva/sclera: Conjunctivae normal.  Cardiovascular:     Rate and Rhythm: Normal rate and regular rhythm.     Heart sounds: Normal heart sounds.  Pulmonary:     Effort: Pulmonary effort is normal.     Breath sounds: Normal breath sounds. No wheezing.  Abdominal:     General: Bowel sounds are normal.     Palpations: Abdomen is soft.     Tenderness: There is no abdominal tenderness.  Musculoskeletal:        General: Normal range of motion.     Cervical back: Normal range of motion.  Skin:    General: Skin is warm and dry.  Neurological:     Mental Status: She is alert.     Cranial Nerves: Cranial nerves are intact. No facial asymmetry.     Sensory: Sensation is intact.     Motor: No pronator drift.     Coordination: Heel to [facility] Test normal. Rapid alternating movements normal.     Comments: Mild tremor noted, but neuro exam otherwise intact.  Equal grip strength.     ED Results / Procedures / Treatments   Labs (all labs ordered are listed, but only abnormal results are displayed) Labs Reviewed  BASIC METABOLIC PANEL - Abnormal; Notable for the following components:      Result Value   Glucose, Bld 110 (*)    All other components  within normal limits  CBC WITH DIFFERENTIAL/PLATELET - Abnormal; Notable for the following components:   WBC 12.2 (*)    Neutro Abs 8.4 (*)    Abs Immature Granulocytes 0.09 (*)    All other components within normal limits  RAPID URINE DRUG SCREEN, HOSP PERFORMED - Abnormal; Notable for the following components:   Benzodiazepines POSITIVE (*)    Tetrahydrocannabinol POSITIVE (*)  All other components within normal limits  CBG MONITORING, ED - Abnormal; Notable for the following components:   Glucose-Capillary 127 (*)    All other components within normal limits  ETHANOL  POC URINE PREG, ED    EKG EKG Interpretation  Date/Time:  Sunday April 12 2019 10:18:19 EDT Ventricular Rate:  91 PR Interval:    QRS Duration: 101 QT Interval:  363 QTC Calculation: 447 R Axis:   100 Text Interpretation: Sinus rhythm Consider right atrial enlargement Borderline right axis deviation Minimal ST depression, inferior leads Since last tracing rate faster Confirmed by Miller, Brian ([id number]) on 04/12/2019 10:24:41 AM   Radiology No results found.  Procedures Procedures (including critical care time)  Medications Ordered in ED Medications  LORazepam (ATIVAN) tablet 2 mg (2 mg Oral Given 04/12/19 1055)    ED Course  I have reviewed the triage vital signs and the nursing notes.  Pertinent labs & imaging results that were available during my care of the patient were reviewed by me and considered in my medical decision making (see chart for details).    MDM Rules/Calculators/A&P                      Patient with history of benzodiazepine dependence with suspected withdrawal seizure.  Labs reviewed, she had no post ictal symptoms while here, she was anxious with mild tremor, initially was hypertensive without tachycardia.  No injury on exam including mouth or tongue contusion or lacerations.  Upon further questioning the patient she states that she has been getting her Xanax off the street, has  been unable to find recently therefore has not had this medication in the past 4 days.  Her UDS is positive for benzos however, this was obtained prior to the dose of Ativan she was given here.  Is unclear whether she truly had a seizure this morning, she has no exam findings to suggest postictal status or injury.  Boyfriend is not available to obtain further info regarding her seizure-like activity.  She was awake and alert at time of discharge.  She was given a short course of Librium to help prevent further seizure.  He was referred to [facility] for further evaluation and management of her anxiety and medication issues.  Denies SI/HI. Final Clinical Impression(s) / ED Diagnoses Final diagnoses:  Seizure (HCC)  Benzodiazepine dependence (HCC)    Rx / DC Orders ED Discharge Orders         Ordered    chlordiazePOXIDE (LIBRIUM) 25 MG capsule     03 /28/21 1307           09-05-1985, PA-C 04/12/19 1703    04/14/19, MD 04/13/19 1733

## 2019-04-12 NOTE — Discharge Instructions (Addendum)
Your lab tests and exam today are reassuring and you have been given todays dose of xanax.  We cannot prescribe this medicine from the emergency department but [facility] (see the referral above) can help with your anxiety and any medications deemed appropriate.

## 2019-10-16 IMAGING — CT CT HEAD W/O CM
3 series · 15 of 47 positions shown, 18 images · non-contrast
Comparison: 05/15/2012

CLINICAL DATA: Two seizures today. Ran out of anti seizure
medication.

EXAM:
CT HEAD WITHOUT CONTRAST
TECHNIQUE: Contiguous axial images were obtained from the base of the skull
through the vertex without intravenous contrast.

[Series 3: head 5.0 h30s · axial · 0.41mm/px · z∈[-88,+47]mm · 9 of 33 slices shown, 12 images]
[im 3/33  brain]
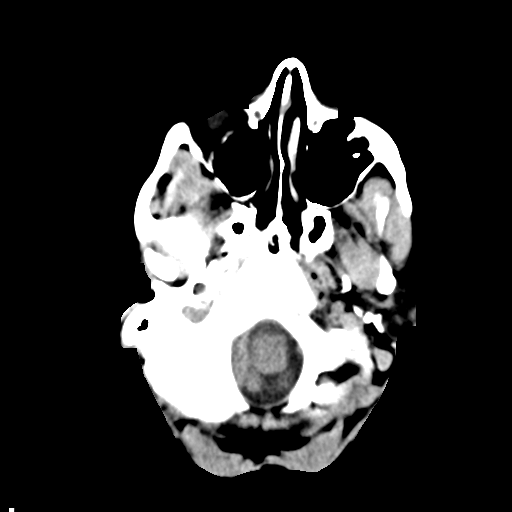
[im 3/33  bone]
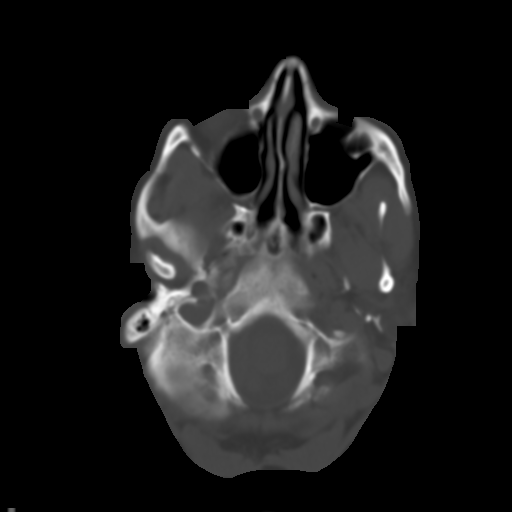
[im 6/33  brain]
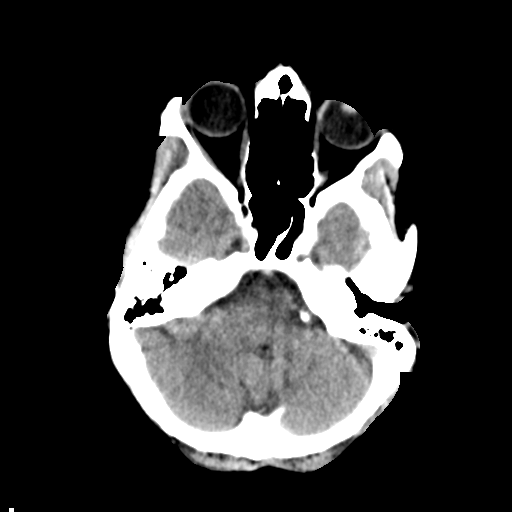
[im 9/33  brain]
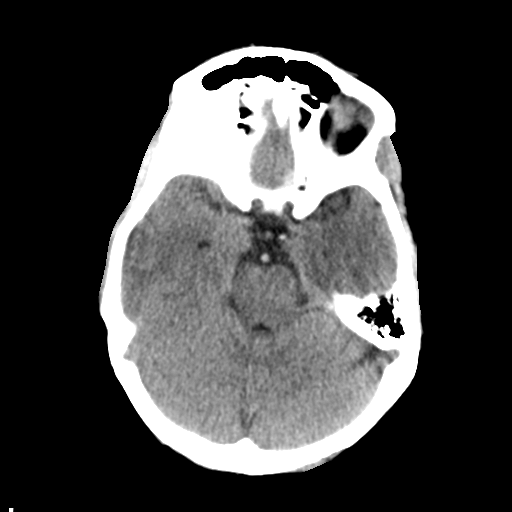
[im 13/33  brain]
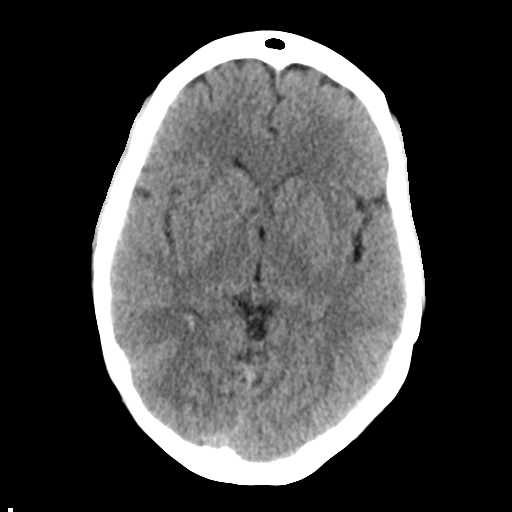
[im 17/33  brain]
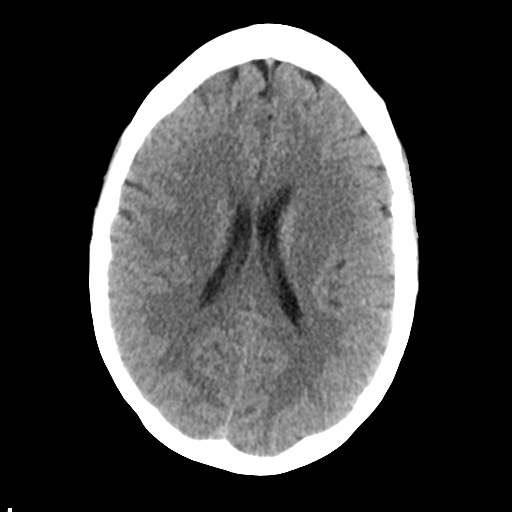
[im 17/33  bone]
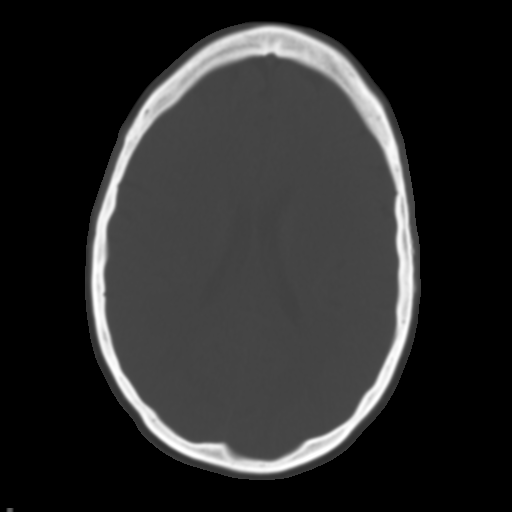
[im 20/33  brain]
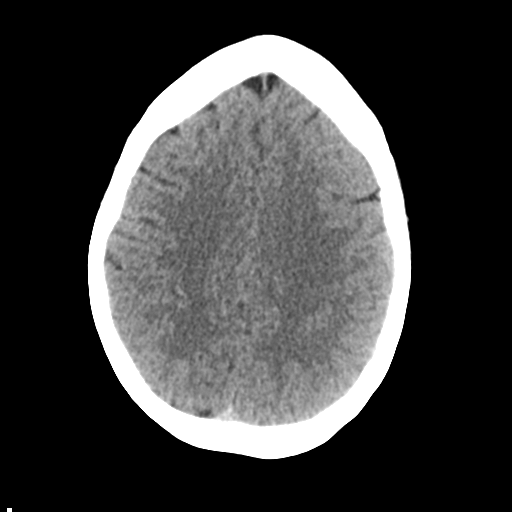
[im 24/33  brain]
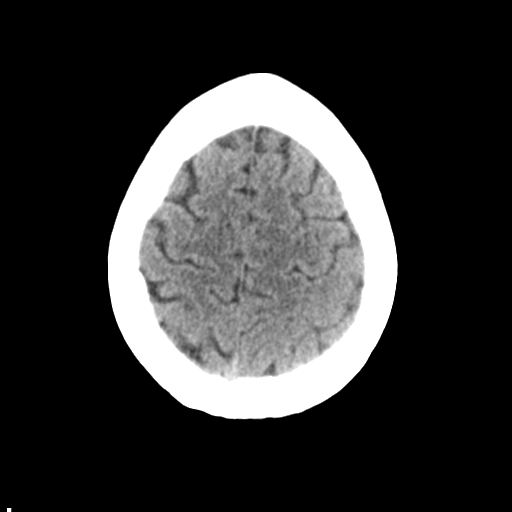
[im 27/33  brain]
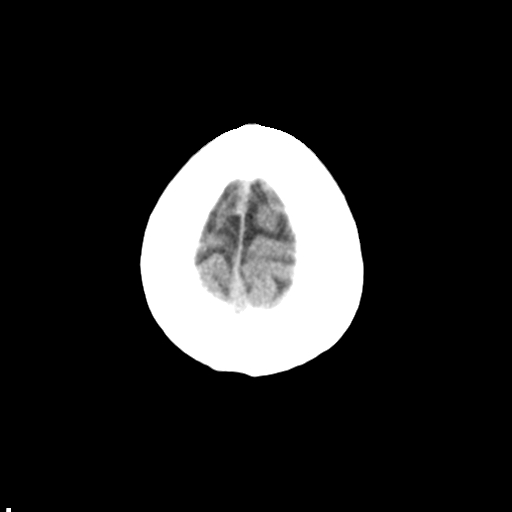
[im 30/33  brain]
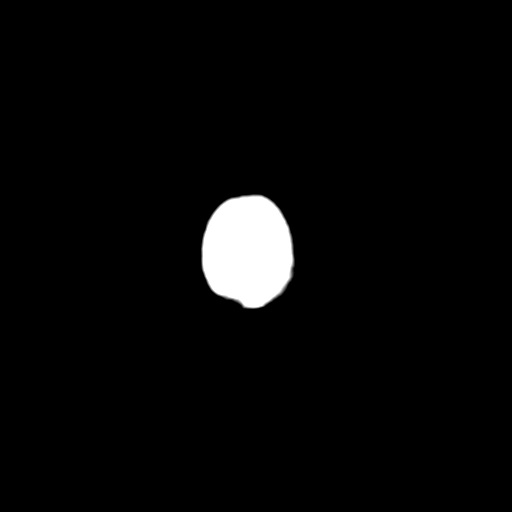
[im 30/33  bone]
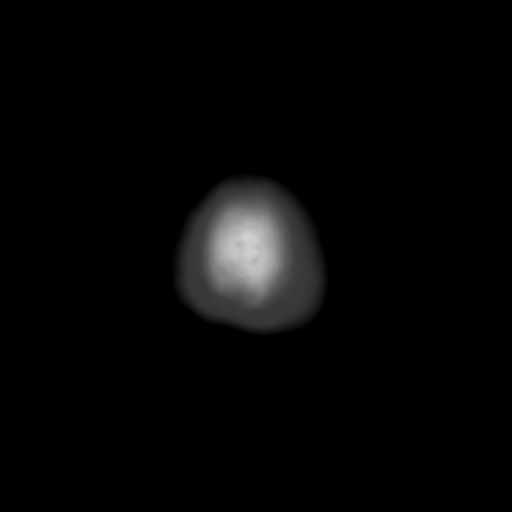

[Series 5: head 3.0 mpr cor · coronal · 0.29mm/px · 3 of 67 slices shown]
[im 23/67  brain]
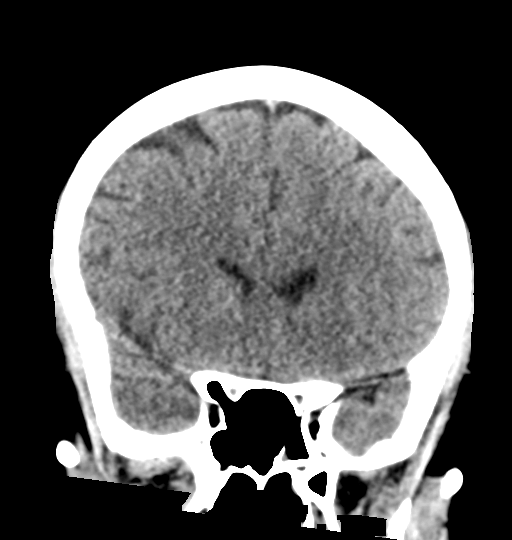
[im 30/67  brain]
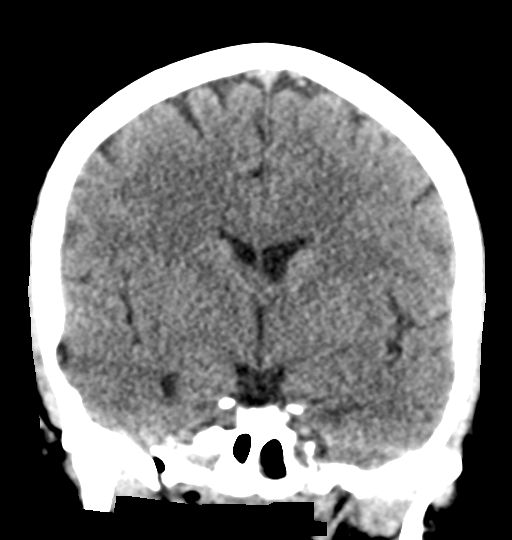
[im 37/67  brain]
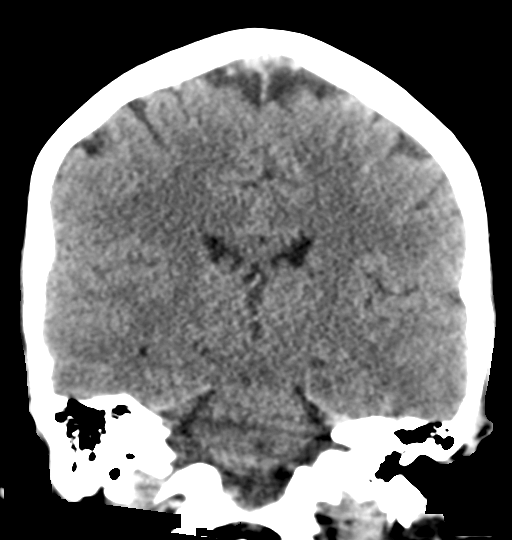

[Series 6: head 3.0 mpr sag · sagittal · 0.30mm/px · 3 of 52 slices shown]
[im 18/52  brain]
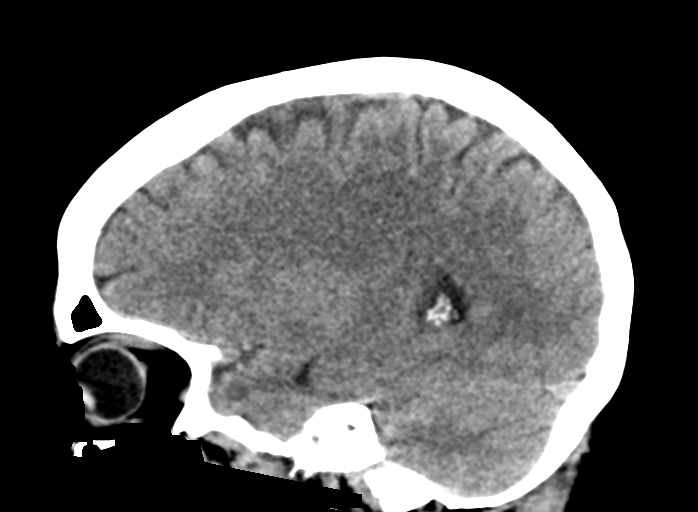
[im 26/52  brain]
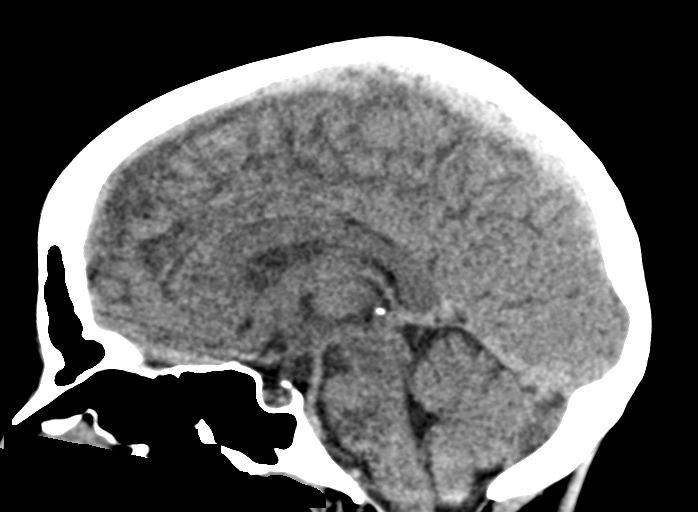
[im 35/52  brain]
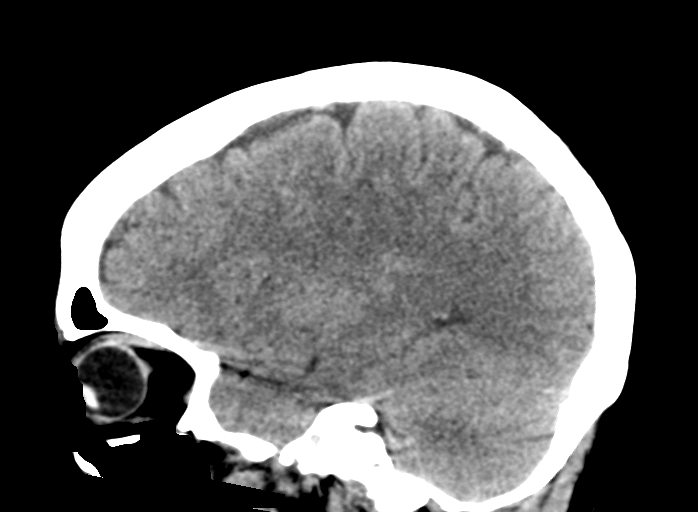

[15 of 47 positions shown; findings below may reference images not displayed]

FINDINGS: Brain: Ventricles, cisterns and other CSF spaces are normal. There
is no mass, mass effect, shift of midline structures or acute
hemorrhage. No evidence of acute infarction.

Vascular: No hyperdense vessel or unexpected calcification.

Skull: Normal. Negative for fracture or focal lesion.

Sinuses/Orbits: Orbits are normal. Paranasal sinuses are well
developed and well aerated as there is minimal mucosal membrane
thickening over the left sphenoid sinus. Mastoid air cells are
clear.

Other: None.
IMPRESSION: No acute findings.

Minimal chronic inflammatory change of the sphenoid sinus.
